# Patient Record
Sex: Female | Born: 1970 | Race: White | Hispanic: Yes | Marital: Married | Smoking: Never smoker
Health system: Southern US, Community
[De-identification: ages and names within clinical notes are randomized; demographics above are authoritative.]

## PROBLEM LIST (undated history)

## (undated) ENCOUNTER — Inpatient Hospital Stay (HOSPITAL_COMMUNITY): Payer: Self-pay

## (undated) DIAGNOSIS — F32A Depression, unspecified: Secondary | ICD-10-CM

## (undated) DIAGNOSIS — E039 Hypothyroidism, unspecified: Secondary | ICD-10-CM

## (undated) DIAGNOSIS — E119 Type 2 diabetes mellitus without complications: Secondary | ICD-10-CM

## (undated) DIAGNOSIS — E669 Obesity, unspecified: Secondary | ICD-10-CM

## (undated) DIAGNOSIS — E785 Hyperlipidemia, unspecified: Secondary | ICD-10-CM

## (undated) DIAGNOSIS — O24419 Gestational diabetes mellitus in pregnancy, unspecified control: Secondary | ICD-10-CM

## (undated) DIAGNOSIS — F329 Major depressive disorder, single episode, unspecified: Secondary | ICD-10-CM

## (undated) DIAGNOSIS — D219 Benign neoplasm of connective and other soft tissue, unspecified: Secondary | ICD-10-CM

## (undated) DIAGNOSIS — R112 Nausea with vomiting, unspecified: Secondary | ICD-10-CM

## (undated) DIAGNOSIS — N390 Urinary tract infection, site not specified: Secondary | ICD-10-CM

## (undated) DIAGNOSIS — Z9889 Other specified postprocedural states: Secondary | ICD-10-CM

## (undated) HISTORY — DX: Obesity, unspecified: E66.9

## (undated) HISTORY — DX: Hypothyroidism, unspecified: E03.9

## (undated) HISTORY — DX: Major depressive disorder, single episode, unspecified: F32.9

## (undated) HISTORY — DX: Hyperlipidemia, unspecified: E78.5

## (undated) HISTORY — DX: Depression, unspecified: F32.A

## (undated) HISTORY — DX: Type 2 diabetes mellitus without complications: E11.9

---

## 2003-09-21 ENCOUNTER — Inpatient Hospital Stay (HOSPITAL_COMMUNITY): Admission: AD | Admit: 2003-09-21 | Discharge: 2003-09-22 | Payer: Self-pay | Admitting: Obstetrics

## 2004-03-27 ENCOUNTER — Inpatient Hospital Stay (HOSPITAL_COMMUNITY): Admission: AD | Admit: 2004-03-27 | Discharge: 2004-03-27 | Payer: Self-pay | Admitting: Obstetrics

## 2004-04-09 ENCOUNTER — Inpatient Hospital Stay (HOSPITAL_COMMUNITY): Admission: AD | Admit: 2004-04-09 | Discharge: 2004-04-12 | Payer: Self-pay | Admitting: Obstetrics

## 2004-06-17 ENCOUNTER — Ambulatory Visit: Payer: Self-pay | Admitting: Internal Medicine

## 2004-06-18 ENCOUNTER — Ambulatory Visit: Payer: Self-pay | Admitting: *Deleted

## 2004-08-05 ENCOUNTER — Ambulatory Visit: Payer: Self-pay | Admitting: Internal Medicine

## 2004-08-13 ENCOUNTER — Ambulatory Visit: Payer: Self-pay | Admitting: Internal Medicine

## 2004-08-13 ENCOUNTER — Ambulatory Visit: Payer: Self-pay | Admitting: *Deleted

## 2004-08-27 ENCOUNTER — Ambulatory Visit: Payer: Self-pay | Admitting: Internal Medicine

## 2004-12-16 ENCOUNTER — Ambulatory Visit: Payer: Self-pay | Admitting: Family Medicine

## 2005-02-13 ENCOUNTER — Ambulatory Visit: Payer: Self-pay | Admitting: Nurse Practitioner

## 2005-04-08 ENCOUNTER — Ambulatory Visit: Payer: Self-pay | Admitting: Nurse Practitioner

## 2005-04-15 ENCOUNTER — Ambulatory Visit: Payer: Self-pay | Admitting: Nurse Practitioner

## 2005-06-04 ENCOUNTER — Ambulatory Visit: Payer: Self-pay | Admitting: Internal Medicine

## 2005-06-18 ENCOUNTER — Ambulatory Visit: Payer: Self-pay | Admitting: Internal Medicine

## 2005-07-30 ENCOUNTER — Ambulatory Visit: Payer: Self-pay | Admitting: Internal Medicine

## 2005-08-10 ENCOUNTER — Ambulatory Visit (HOSPITAL_COMMUNITY): Admission: RE | Admit: 2005-08-10 | Discharge: 2005-08-10 | Payer: Self-pay | Admitting: Family Medicine

## 2005-08-26 ENCOUNTER — Ambulatory Visit: Payer: Self-pay | Admitting: Internal Medicine

## 2005-09-14 HISTORY — PX: DILATION AND CURETTAGE OF UTERUS: SHX78

## 2005-09-21 ENCOUNTER — Ambulatory Visit: Payer: Self-pay | Admitting: Internal Medicine

## 2005-11-03 ENCOUNTER — Ambulatory Visit: Payer: Self-pay | Admitting: Obstetrics & Gynecology

## 2005-12-08 ENCOUNTER — Encounter: Payer: Self-pay | Admitting: Internal Medicine

## 2005-12-08 ENCOUNTER — Ambulatory Visit: Payer: Self-pay | Admitting: Internal Medicine

## 2006-01-01 ENCOUNTER — Ambulatory Visit: Payer: Self-pay | Admitting: Internal Medicine

## 2006-01-08 ENCOUNTER — Ambulatory Visit: Payer: Self-pay | Admitting: Internal Medicine

## 2006-03-03 ENCOUNTER — Ambulatory Visit: Payer: Self-pay | Admitting: Family Medicine

## 2006-04-07 ENCOUNTER — Ambulatory Visit: Payer: Self-pay | Admitting: Family Medicine

## 2006-05-02 ENCOUNTER — Emergency Department (HOSPITAL_COMMUNITY): Admission: EM | Admit: 2006-05-02 | Discharge: 2006-05-02 | Payer: Self-pay | Admitting: Emergency Medicine

## 2006-05-05 ENCOUNTER — Ambulatory Visit: Payer: Self-pay | Admitting: Internal Medicine

## 2006-05-24 ENCOUNTER — Ambulatory Visit: Payer: Self-pay | Admitting: Family Medicine

## 2006-06-18 ENCOUNTER — Ambulatory Visit: Payer: Self-pay | Admitting: Internal Medicine

## 2006-06-30 ENCOUNTER — Ambulatory Visit: Payer: Self-pay | Admitting: Family Medicine

## 2006-07-09 ENCOUNTER — Ambulatory Visit: Payer: Self-pay | Admitting: Family Medicine

## 2006-07-27 ENCOUNTER — Emergency Department (HOSPITAL_COMMUNITY): Admission: EM | Admit: 2006-07-27 | Discharge: 2006-07-28 | Payer: Self-pay | Admitting: Emergency Medicine

## 2006-08-11 ENCOUNTER — Inpatient Hospital Stay (HOSPITAL_COMMUNITY): Admission: AD | Admit: 2006-08-11 | Discharge: 2006-08-11 | Payer: Self-pay | Admitting: Obstetrics

## 2006-09-09 ENCOUNTER — Ambulatory Visit (HOSPITAL_COMMUNITY): Admission: AD | Admit: 2006-09-09 | Discharge: 2006-09-09 | Payer: Self-pay | Admitting: Obstetrics

## 2006-09-09 ENCOUNTER — Inpatient Hospital Stay (HOSPITAL_COMMUNITY): Admission: AD | Admit: 2006-09-09 | Discharge: 2006-09-09 | Payer: Self-pay | Admitting: Obstetrics and Gynecology

## 2006-09-09 ENCOUNTER — Encounter (INDEPENDENT_AMBULATORY_CARE_PROVIDER_SITE_OTHER): Payer: Self-pay | Admitting: *Deleted

## 2006-10-29 ENCOUNTER — Ambulatory Visit: Payer: Self-pay | Admitting: Family Medicine

## 2006-12-14 ENCOUNTER — Ambulatory Visit: Payer: Self-pay | Admitting: Internal Medicine

## 2007-03-24 ENCOUNTER — Ambulatory Visit: Payer: Self-pay | Admitting: Internal Medicine

## 2007-03-28 ENCOUNTER — Ambulatory Visit: Payer: Self-pay | Admitting: Internal Medicine

## 2007-04-05 ENCOUNTER — Ambulatory Visit: Payer: Self-pay | Admitting: Internal Medicine

## 2007-06-01 ENCOUNTER — Encounter (INDEPENDENT_AMBULATORY_CARE_PROVIDER_SITE_OTHER): Payer: Self-pay | Admitting: *Deleted

## 2007-06-29 ENCOUNTER — Ambulatory Visit: Payer: Self-pay | Admitting: Internal Medicine

## 2007-06-29 LAB — CONVERTED CEMR LAB
AST: 15 units/L (ref 0–37)
Albumin: 4.7 g/dL (ref 3.5–5.2)
BUN: 12 mg/dL (ref 6–23)
CO2: 22 meq/L (ref 19–32)
Chloride: 105 meq/L (ref 96–112)
Potassium: 4.9 meq/L (ref 3.5–5.3)
Total Protein: 7.9 g/dL (ref 6.0–8.3)
Triglycerides: 139 mg/dL (ref <150)
VLDL: 28 mg/dL (ref 0–40)

## 2007-08-15 ENCOUNTER — Ambulatory Visit: Payer: Self-pay | Admitting: Internal Medicine

## 2007-08-29 ENCOUNTER — Encounter (INDEPENDENT_AMBULATORY_CARE_PROVIDER_SITE_OTHER): Payer: Self-pay | Admitting: Family Medicine

## 2007-08-29 ENCOUNTER — Ambulatory Visit: Payer: Self-pay | Admitting: Internal Medicine

## 2007-08-29 LAB — CONVERTED CEMR LAB: Chlamydia, DNA Probe: NEGATIVE

## 2007-10-10 ENCOUNTER — Ambulatory Visit: Payer: Self-pay | Admitting: Family Medicine

## 2007-10-10 LAB — CONVERTED CEMR LAB
BUN: 11 mg/dL (ref 6–23)
Calcium: 9.4 mg/dL (ref 8.4–10.5)
Chloride: 105 meq/L (ref 96–112)
Cholesterol: 220 mg/dL — ABNORMAL HIGH (ref 0–200)
Glucose, Bld: 94 mg/dL (ref 70–99)
LDL Cholesterol: 130 mg/dL — ABNORMAL HIGH (ref 0–99)
Sodium: 140 meq/L (ref 135–145)
Total Bilirubin: 0.3 mg/dL (ref 0.3–1.2)
Total CHOL/HDL Ratio: 4.2
Total Protein: 7.8 g/dL (ref 6.0–8.3)

## 2007-11-09 ENCOUNTER — Ambulatory Visit: Payer: Self-pay | Admitting: Internal Medicine

## 2007-11-09 ENCOUNTER — Encounter (INDEPENDENT_AMBULATORY_CARE_PROVIDER_SITE_OTHER): Payer: Self-pay | Admitting: Family Medicine

## 2007-11-14 ENCOUNTER — Ambulatory Visit (HOSPITAL_COMMUNITY): Admission: RE | Admit: 2007-11-14 | Discharge: 2007-11-14 | Payer: Self-pay | Admitting: Family Medicine

## 2007-12-12 ENCOUNTER — Ambulatory Visit: Payer: Self-pay | Admitting: Internal Medicine

## 2007-12-12 LAB — CONVERTED CEMR LAB
Basophils Relative: 1 % (ref 0–1)
Eosinophils Absolute: 0.2 10*3/uL (ref 0.0–0.7)
HCT: 34.2 % — ABNORMAL LOW (ref 36.0–46.0)
Hemoglobin: 11 g/dL — ABNORMAL LOW (ref 12.0–15.0)
MCHC: 32.2 g/dL (ref 30.0–36.0)
Monocytes Absolute: 0.3 10*3/uL (ref 0.1–1.0)
Monocytes Relative: 6 % (ref 3–12)
RBC: 4.32 M/uL (ref 3.87–5.11)
WBC: 4.6 10*3/uL (ref 4.0–10.5)

## 2008-01-04 ENCOUNTER — Ambulatory Visit: Payer: Self-pay | Admitting: Internal Medicine

## 2008-02-23 ENCOUNTER — Ambulatory Visit: Payer: Self-pay | Admitting: Obstetrics & Gynecology

## 2008-03-12 ENCOUNTER — Ambulatory Visit: Payer: Self-pay | Admitting: Family Medicine

## 2008-03-14 ENCOUNTER — Inpatient Hospital Stay (HOSPITAL_COMMUNITY): Admission: AD | Admit: 2008-03-14 | Discharge: 2008-03-14 | Payer: Self-pay | Admitting: Gynecology

## 2008-05-11 ENCOUNTER — Inpatient Hospital Stay (HOSPITAL_COMMUNITY): Admission: AD | Admit: 2008-05-11 | Discharge: 2008-05-11 | Payer: Self-pay | Admitting: Obstetrics & Gynecology

## 2008-05-31 ENCOUNTER — Ambulatory Visit: Payer: Self-pay | Admitting: Obstetrics & Gynecology

## 2008-06-06 ENCOUNTER — Ambulatory Visit: Payer: Self-pay | Admitting: Family Medicine

## 2008-06-11 ENCOUNTER — Ambulatory Visit (HOSPITAL_COMMUNITY): Admission: RE | Admit: 2008-06-11 | Discharge: 2008-06-11 | Payer: Self-pay | Admitting: Family Medicine

## 2008-06-28 ENCOUNTER — Ambulatory Visit: Payer: Self-pay | Admitting: Obstetrics & Gynecology

## 2008-06-28 ENCOUNTER — Ambulatory Visit (HOSPITAL_COMMUNITY): Admission: RE | Admit: 2008-06-28 | Discharge: 2008-06-28 | Payer: Self-pay | Admitting: Family Medicine

## 2008-07-05 ENCOUNTER — Ambulatory Visit: Payer: Self-pay | Admitting: Obstetrics & Gynecology

## 2008-07-05 ENCOUNTER — Encounter (INDEPENDENT_AMBULATORY_CARE_PROVIDER_SITE_OTHER): Payer: Self-pay | Admitting: Gynecology

## 2008-07-19 ENCOUNTER — Ambulatory Visit: Payer: Self-pay | Admitting: Obstetrics & Gynecology

## 2008-08-02 ENCOUNTER — Ambulatory Visit: Payer: Self-pay | Admitting: Obstetrics & Gynecology

## 2008-08-23 ENCOUNTER — Ambulatory Visit: Payer: Self-pay | Admitting: Family Medicine

## 2008-08-23 ENCOUNTER — Encounter: Payer: Self-pay | Admitting: Obstetrics & Gynecology

## 2008-08-23 LAB — CONVERTED CEMR LAB
MCHC: 34.2 g/dL (ref 30.0–36.0)
Platelets: 211 10*3/uL (ref 150–400)
RBC: 4.1 M/uL (ref 3.87–5.11)
WBC: 6.5 10*3/uL (ref 4.0–10.5)

## 2008-09-03 ENCOUNTER — Encounter: Admission: RE | Admit: 2008-09-03 | Discharge: 2008-09-03 | Payer: Self-pay | Admitting: Obstetrics & Gynecology

## 2008-09-03 ENCOUNTER — Ambulatory Visit: Payer: Self-pay | Admitting: Family Medicine

## 2008-09-10 ENCOUNTER — Ambulatory Visit (HOSPITAL_COMMUNITY): Admission: RE | Admit: 2008-09-10 | Discharge: 2008-09-10 | Payer: Self-pay | Admitting: Family Medicine

## 2008-09-10 ENCOUNTER — Ambulatory Visit: Payer: Self-pay | Admitting: Family Medicine

## 2008-09-17 ENCOUNTER — Ambulatory Visit: Payer: Self-pay | Admitting: Family Medicine

## 2008-09-17 ENCOUNTER — Encounter (INDEPENDENT_AMBULATORY_CARE_PROVIDER_SITE_OTHER): Payer: Self-pay | Admitting: Gynecology

## 2008-10-01 ENCOUNTER — Ambulatory Visit: Payer: Self-pay | Admitting: Family Medicine

## 2008-10-02 ENCOUNTER — Inpatient Hospital Stay (HOSPITAL_COMMUNITY): Admission: AD | Admit: 2008-10-02 | Discharge: 2008-10-05 | Payer: Self-pay | Admitting: Family Medicine

## 2008-10-02 ENCOUNTER — Ambulatory Visit: Payer: Self-pay | Admitting: Family Medicine

## 2008-10-03 ENCOUNTER — Encounter: Payer: Self-pay | Admitting: Obstetrics & Gynecology

## 2008-12-26 ENCOUNTER — Ambulatory Visit: Payer: Self-pay | Admitting: Internal Medicine

## 2009-05-06 ENCOUNTER — Ambulatory Visit: Payer: Self-pay | Admitting: Internal Medicine

## 2009-07-18 ENCOUNTER — Ambulatory Visit: Payer: Self-pay | Admitting: Family Medicine

## 2009-10-16 ENCOUNTER — Ambulatory Visit: Payer: Self-pay | Admitting: Internal Medicine

## 2009-10-16 LAB — CONVERTED CEMR LAB
ALT: 10 units/L (ref 0–35)
AST: 16 units/L (ref 0–37)
Albumin: 4.6 g/dL (ref 3.5–5.2)
Alkaline Phosphatase: 71 units/L (ref 39–117)
Basophils Relative: 1 % (ref 0–1)
Calcium: 9 mg/dL (ref 8.4–10.5)
Creatinine, Ser: 0.53 mg/dL (ref 0.40–1.20)
Eosinophils Absolute: 0.3 10*3/uL (ref 0.0–0.7)
Ferritin: 14 ng/mL (ref 10–291)
HCT: 39 % (ref 36.0–46.0)
Iron: 75 ug/dL (ref 42–145)
Lymphocytes Relative: 54 % — ABNORMAL HIGH (ref 12–46)
Lymphs Abs: 3 10*3/uL (ref 0.7–4.0)
MCHC: 33.6 g/dL (ref 30.0–36.0)
Monocytes Absolute: 0.3 10*3/uL (ref 0.1–1.0)
Neutro Abs: 2 10*3/uL (ref 1.7–7.7)
Neutrophils Relative %: 35 % — ABNORMAL LOW (ref 43–77)
Platelets: 222 10*3/uL (ref 150–400)
Total Protein: 7.8 g/dL (ref 6.0–8.3)

## 2009-10-17 ENCOUNTER — Ambulatory Visit: Payer: Self-pay | Admitting: Internal Medicine

## 2009-10-30 ENCOUNTER — Ambulatory Visit (HOSPITAL_COMMUNITY): Admission: RE | Admit: 2009-10-30 | Discharge: 2009-10-30 | Payer: Self-pay | Admitting: Internal Medicine

## 2009-10-30 ENCOUNTER — Ambulatory Visit: Payer: Self-pay | Admitting: Internal Medicine

## 2009-10-30 LAB — CONVERTED CEMR LAB
Basophils Absolute: 0 10*3/uL (ref 0.0–0.1)
Basophils Relative: 1 % (ref 0–1)
CO2: 24 meq/L (ref 19–32)
Calcium: 9.5 mg/dL (ref 8.4–10.5)
Cholesterol: 229 mg/dL — ABNORMAL HIGH (ref 0–200)
Eosinophils Absolute: 0.2 10*3/uL (ref 0.0–0.7)
Eosinophils Relative: 3 % (ref 0–5)
HDL: 57 mg/dL (ref >39)
Hemoglobin: 13.5 g/dL (ref 12.0–15.0)
Iron: 69 ug/dL (ref 42–145)
Lymphocytes Relative: 54 % — ABNORMAL HIGH (ref 12–46)
Lymphs Abs: 2.7 10*3/uL (ref 0.7–4.0)
Monocytes Relative: 5 % (ref 3–12)
Neutrophils Relative %: 38 % — ABNORMAL LOW (ref 43–77)
Potassium: 4.1 meq/L (ref 3.5–5.3)
Saturation Ratios: 16 % — ABNORMAL LOW (ref 20–55)
Total Bilirubin: 0.4 mg/dL (ref 0.3–1.2)
Total Protein: 8.2 g/dL (ref 6.0–8.3)
VLDL: 19 mg/dL (ref 0–40)

## 2009-11-13 ENCOUNTER — Ambulatory Visit: Payer: Self-pay | Admitting: Internal Medicine

## 2009-11-20 ENCOUNTER — Ambulatory Visit (HOSPITAL_COMMUNITY): Admission: RE | Admit: 2009-11-20 | Discharge: 2009-11-20 | Payer: Self-pay | Admitting: *Deleted

## 2009-12-05 ENCOUNTER — Ambulatory Visit: Payer: Self-pay | Admitting: Internal Medicine

## 2009-12-19 ENCOUNTER — Ambulatory Visit: Payer: Self-pay | Admitting: Obstetrics and Gynecology

## 2009-12-19 LAB — CONVERTED CEMR LAB
Chlamydia, DNA Probe: NEGATIVE
GC Probe Amp, Genital: NEGATIVE

## 2009-12-20 ENCOUNTER — Encounter: Payer: Self-pay | Admitting: Obstetrics and Gynecology

## 2009-12-20 LAB — CONVERTED CEMR LAB
Trich, Wet Prep: NONE SEEN
Yeast Wet Prep HPF POC: NONE SEEN

## 2010-01-16 ENCOUNTER — Ambulatory Visit: Payer: Self-pay | Admitting: Obstetrics & Gynecology

## 2010-02-27 ENCOUNTER — Ambulatory Visit: Payer: Self-pay | Admitting: Internal Medicine

## 2010-02-27 LAB — CONVERTED CEMR LAB
HDL: 47 mg/dL (ref >39)
Hgb A1c MFr Bld: 6.3 % — ABNORMAL HIGH (ref <5.7)

## 2010-03-06 ENCOUNTER — Ambulatory Visit: Payer: Self-pay | Admitting: Internal Medicine

## 2010-03-06 LAB — CONVERTED CEMR LAB
Ferritin: 34 ng/mL (ref 10–291)
Free Thyroxine Index: 4 — ABNORMAL HIGH (ref 1.0–3.9)
T3 Uptake Ratio: 33.2 % (ref 22.5–37.0)
T4, Total: 12 ug/dL (ref 5.0–12.5)
TSH: 0.028 microintl units/mL — ABNORMAL LOW (ref 0.350–4.500)
Thyroglobulin Ab: 325.6 — ABNORMAL HIGH (ref 0.0–60.0)
Thyroperoxidase Ab SerPl-aCnc: 8952.2 — ABNORMAL HIGH (ref 0.0–60.0)

## 2010-03-27 ENCOUNTER — Ambulatory Visit: Payer: Self-pay | Admitting: Internal Medicine

## 2010-03-27 LAB — CONVERTED CEMR LAB
T3, Free: 4.6 pg/mL — ABNORMAL HIGH (ref 2.3–4.2)
TSH: <0.004 microintl units/mL — ABNORMAL LOW (ref 0.350–4.500)
Vit D, 25-Hydroxy: 39 ng/mL (ref 30–89)

## 2010-05-08 ENCOUNTER — Ambulatory Visit: Payer: Self-pay | Admitting: Internal Medicine

## 2010-08-04 ENCOUNTER — Ambulatory Visit: Payer: Self-pay | Admitting: Internal Medicine

## 2010-10-04 ENCOUNTER — Encounter: Payer: Self-pay | Admitting: Internal Medicine

## 2010-10-05 ENCOUNTER — Encounter: Payer: Self-pay | Admitting: Family Medicine

## 2010-12-08 ENCOUNTER — Encounter: Payer: Self-pay | Admitting: Internal Medicine

## 2010-12-08 ENCOUNTER — Other Ambulatory Visit: Payer: Self-pay | Admitting: Family Medicine

## 2010-12-29 LAB — POCT URINALYSIS DIP (DEVICE)
Bilirubin Urine: NEGATIVE
Ketones, ur: NEGATIVE mg/dL
Nitrite: NEGATIVE
Specific Gravity, Urine: 1.01 (ref 1.005–1.030)
Urobilinogen, UA: 0.2 mg/dL (ref 0.0–1.0)
Urobilinogen, UA: 0.2 mg/dL (ref 0.0–1.0)
pH: 7 (ref 5.0–8.0)

## 2010-12-29 LAB — URINALYSIS, ROUTINE W REFLEX MICROSCOPIC
Ketones, ur: 15 mg/dL — AB
Leukocytes, UA: NEGATIVE
Nitrite: NEGATIVE
Protein, ur: NEGATIVE mg/dL
pH: 6 (ref 5.0–8.0)

## 2010-12-29 LAB — GLUCOSE, CAPILLARY
Glucose-Capillary: 66 mg/dL — ABNORMAL LOW (ref 70–99)
Glucose-Capillary: 99 mg/dL (ref 70–99)

## 2010-12-29 LAB — STREP B DNA PROBE: Strep Group B Ag: NEGATIVE

## 2010-12-29 LAB — WET PREP, GENITAL: Clue Cells Wet Prep HPF POC: NONE SEEN

## 2010-12-29 LAB — URINE CULTURE: Colony Count: 30000

## 2010-12-29 LAB — CBC
Hemoglobin: 13.2 g/dL (ref 12.0–15.0)
RBC: 4.25 MIL/uL (ref 3.87–5.11)
WBC: 6.6 10*3/uL (ref 4.0–10.5)

## 2010-12-29 LAB — RPR: RPR Ser Ql: NONREACTIVE

## 2010-12-29 LAB — URINE MICROSCOPIC-ADD ON

## 2011-01-27 NOTE — Group Therapy Note (Signed)
NAMEKALIYA, SHREINER NO.:  [id number]   MEDICAL RECORD NO.:  [id number]          PATIENT TYPE:  WOC   LOCATION:  [facility]                   FACILITY:  [facility]   PHYSICIAN:  Allie Bossier, MD        DATE OF BIRTH:  1971-04-16   DATE OF SERVICE:  02/23/2008                                  CLINIC NOTE   Sarah Massey is a 40 year old married Hispanic gravida 6, para 4,  abortus 2 with four sons.  She comes in here as a referral from  [facility] to evaluate two-year history of dysmenorrhea.  An ultrasound  was done November 14, 2007, that showed a 2 cm posterior fibroid.  She says  that she does get some relief when she takes over-the-counter ibuprofen  twice a day or Tylenol.  The pain is primarily located in her left lower  quadrant.  She took oral contraceptive pills from October 2008 to  January 2009 to see if the pain would be relieved.  This did not  alleviate her pain.  She then used no contraception for February, March  and April and she started using condoms this past month because she did  not want to show up pregnant for her visit.  However, she does wish to  be pregnant and understands that she should start prenatal  vitamins/folic acid and that condoms will keep her from getting  pregnant.   MEDICAL HISTORY:  Dysmenorrhea and asthma.   REVIEW OF SYSTEMS:  She has been married for the last 22 years.  She  denies dyspareunia except with her periods.  She reports her periods are  every 22 days and last for five days.  She has a Pap smear scheduled  March 09, 2008, at [facility].   PAST SURGICAL HISTORY:  Two D&Cs after miscarriages. C-section x1.  Please note, she was diabetic with her last pregnancy.   FAMILY HISTORY:  Negative for breast, GYN and colon malignancies.   ALLERGIES:  No latex allergies.  No allergies to medicines.   SOCIAL HISTORY:  Negative for breast, GYN and colon malignancies social  history is negative for tobacco, alcohol or drug  use.   PHYSICAL EXAMINATION:  VITAL SIGNS:  Weight 155 pounds.  Please note  this is a 30 pound weight loss since last year at the recommendation of  her [facility] physician.  Her blood pressure is 199/67, her pulse is  65.  She is afebrile  GU:  External genitalia is normal.  Cervix is normal.  Uterus is 6-week  size, anteverted, mobile and nontender.  Adnexa are not enlarged and  nontender with palpation.  On her pelvic exam, she does have acanthosis  nigricans.   ASSESSMENT/PLAN:  1. Dysmenorrhea.  I have checked cervical cultures.  2. Acanthosis nigricans and polyuria.  I have checked a CBG, randomly      is 102.  I have counseled her about the risks of diabetes      especially after gestational diabetes and encourage more weight      loss.  3. Desire for pregnancy.  I have recommended prenatal vitamins plus      folic acid and suggested that she  take Motrin for her pain.  I have      given her a prescription for 800 mg p.o. q.8h.      Allie Bossier, MD     MCD/MEDQ  D:  02/23/2008  T:  02/23/2008  Job:  [id number]

## 2011-01-30 NOTE — Discharge Summary (Signed)
Sarah Massey, Sarah Massey                ACCOUNT NO.:  [id number]   MEDICAL RECORD NO.:  [id number]                   PATIENT TYPE:  INP   LOCATION:  9109                                 [facility]   PHYSICIAN:  Kathreen Cosier, M.D.           DATE OF BIRTH:  [birth year]   DATE OF ADMISSION:  04/09/2004  DATE OF DISCHARGE:  04/12/2004                                 DISCHARGE SUMMARY   The patient is a 40 year old, gravida 4, para 2-1-0-3 whose due date was  May 02, 2004.  The patient was diabetic in this pregnancy and was  followed by [facility]. She was not on any medications and her sugars  all remained normal.  She was admitted at 36 weeks in labor, breech  presentation, double-footling and underwent a Primary low transverse  cesarean section by Roseanna Rainbow, M.D.  Apgar's were 8 & 9.  Her  urinalysis was negative.  On admission, her hemoglobin was 13.7, platelets  206, postop hemoglobin was 10.6. She did well and was discharged home on the  third postoperative day ambulatory on a regular diet to see me in six weeks.   DISCHARGE DIAGNOSES:  Status post Primary low transverse cesarean section at  36 weeks because of breech presentation.                                               Kathreen Cosier, M.D.    BAM/MEDQ  D:  04/12/2004  T:  04/12/2004  Job:  [id number]

## 2011-01-30 NOTE — Op Note (Signed)
Sarah Massey, Sarah Massey                ACCOUNT NO.:  [id number]   MEDICAL RECORD NO.:  [id number]                   PATIENT TYPE:  INP   LOCATION:  9199                                 [facility]   PHYSICIAN:  Roseanna Rainbow, M.D.         DATE OF BIRTH:  July 07, 1971   DATE OF PROCEDURE:  04/09/2004  DATE OF DISCHARGE:                                 OPERATIVE REPORT   PREOPERATIVE DIAGNOSES:  Intrauterine pregnancy at 36 plus weeks with  preterm premature rupture of membranes, breech presentation.   POSTOPERATIVE DIAGNOSES:  Intrauterine pregnancy at 36 plus weeks with  preterm premature rupture of membranes, breech presentation.  Doubling-  footling breech.   PROCEDURE:  Primary low uterine elliptical cesarean delivery via  Pfannenstiel skin incision.   SURGEON:  Roseanna Rainbow, M.D.   ANESTHESIA:  Spinal.   ESTIMATED BLOOD LOSS:  800 mL.   URINE OUTPUT:  As per anesthesiology.   COMPLICATIONS:  None.   DESCRIPTION OF PROCEDURE:  The patient was taken to the operating room where  a spinal anesthetic was administered without difficulty. She was then placed  in the dorsal supine position with a leftward tilt.  She was prepped and  draped in the usual sterile fashion. A Pfannenstiel skin incision was then  made with the scalpel and carried down to the underlying fascia with the  Bovie. The fascia was incised in the midline. This incision was then  extended bilaterally with curved Mayo scissors. The superior aspect of the  fascial incision was then grasped with Kocher clamps, tinted up in the  underlying rectus muscles and dissected off. The inferior aspect of the  fascial incision was then manipulated in a similar fashion. The rectus  muscles were separated in the midline. The parietoperitoneum was tinted up  and entered sharply with Metzenbaum scissors. This incision was then  extended superiorly and inferiorly with good visualization of the  bladder.  The bladder blade was then placed. The vesicouterine peritoneum was tinted  up and entered sharply. This incision was then extended bilaterally and the  bladder flap created digitally. The bladder blade was then replaced.  The  lower uterine segment was then incised in a transverse fashion. This  incision was extended bilaterally with bandage scissors. A complete breech  extraction was then performed. The infant was suctioned with bulb suction.  The cord was clamped and the infant was handed over to the awaiting  neonatologist. Apgar's were 8 & 9 at 1 and 5 minutes respectively. The  placenta was then removed. The uterus was evacuated of any amniotic fluid  clots and debrided with a moistened laparotomy sponge.  The uterine incision  was reapproximated with #0 Monocryl in a running locking fashion. A second  imbricating layer was then placed.  Adequate hemostasis was noted. The  pericolic gutters were then copiously irrigated. The  parietoperitoneum was reapproximated in a running fashion with 2-0 Vicryl.  The fascia was reapproximated with #0 Vicryl in a running  fashion. The skin  was reapproximated with staples. At the close of the procedure, the  instrument and pack counts were said to be correct x2. The patient was taken  to the PACU awake and in stable condition.                                               Roseanna Rainbow, M.D.    Judee Clara  D:  04/09/2004  T:  04/09/2004  Job:  [id number]

## 2011-01-30 NOTE — Op Note (Signed)
NAME:  Sarah Massey, Sarah Massey     ACCOUNT NO.:  [id number]   MEDICAL RECORD NO.:  [id number]          PATIENT TYPE:  AMB   LOCATION:                                [facility]   PHYSICIAN:  Kathreen Cosier, M.D.DATE OF BIRTH:  1970/11/11   DATE OF PROCEDURE:  09/09/2006  DATE OF DISCHARGE:  09/09/2006                               OPERATIVE REPORT   PREOPERATIVE DIAGNOSIS:  A 12+ week intrauterine fetal demise.   POSTOPERATIVE DIAGNOSIS:  A 12+ week intrauterine fetal demise.   PROCEDURE:  Dilatation and evacuation.   SURGEON:  Kathreen Cosier, M.D.   ANESTHESIA:  MAC   DESCRIPTION OF PROCEDURE:  Using MAC with the patient in the lithotomy  position, the perineum and vagina prepped and draped.  Bladder emptied  with a straight catheter.  Bimanual exam revealed the uterus to be 12-14  weeks' size.  Speculum placed in the vagina.  The cervix injected with  10 mL of 1% Xylocaine.  The endometrial cavity sounded to 13 cm.  The  cervix was dilated with #33 Shawnie Pons.  A #12 suction used to aspirate the  uterine contents until clean.  A sharp curettage was also performed post  suction.   The patient tolerated the procedure well and taken to the recovery room  in good condition.           ______________________________  Kathreen Cosier, M.D.     BAM/MEDQ  D:  09/09/2006  T:  09/09/2006  Job:  [id number]

## 2011-03-25 ENCOUNTER — Other Ambulatory Visit (HOSPITAL_COMMUNITY): Payer: Self-pay | Admitting: Family Medicine

## 2011-03-25 DIAGNOSIS — E0590 Thyrotoxicosis, unspecified without thyrotoxic crisis or storm: Secondary | ICD-10-CM

## 2011-04-13 ENCOUNTER — Encounter (HOSPITAL_COMMUNITY)
Admission: RE | Admit: 2011-04-13 | Discharge: 2011-04-13 | Disposition: A | Discharge status: Home / Self Care | Payer: Self-pay | Source: Ambulatory Visit | Attending: Family Medicine | Admitting: Family Medicine

## 2011-04-13 DIAGNOSIS — E0590 Thyrotoxicosis, unspecified without thyrotoxic crisis or storm: Secondary | ICD-10-CM | POA: Insufficient documentation

## 2011-04-14 ENCOUNTER — Encounter (HOSPITAL_COMMUNITY)
Admission: RE | Admit: 2011-04-14 | Discharge: 2011-04-14 | Disposition: A | Discharge status: Home / Self Care | Payer: Self-pay | Source: Ambulatory Visit | Attending: Family Medicine | Admitting: Family Medicine

## 2011-04-14 MED ORDER — SODIUM IODIDE I 131 CAPSULE
9.2000 | Freq: Once | INTRAVENOUS | Status: AC | PRN
  Administered 2011-04-14: 9.2 via ORAL

## 2011-04-14 MED ORDER — SODIUM PERTECHNETATE TC 99M INJECTION
10.0000 | Freq: Once | INTRAVENOUS | Status: AC | PRN
  Administered 2011-04-14: 10 via INTRAVENOUS

## 2011-06-11 LAB — URINALYSIS, ROUTINE W REFLEX MICROSCOPIC
Ketones, ur: NEGATIVE
Leukocytes, UA: NEGATIVE
Nitrite: NEGATIVE
Protein, ur: NEGATIVE
Urobilinogen, UA: 0.2

## 2011-06-11 LAB — GC/CHLAMYDIA PROBE AMP, GENITAL: Chlamydia, DNA Probe: NEGATIVE

## 2011-06-11 LAB — HCG, QUANTITATIVE, PREGNANCY: hCG, Beta Chain, Quant, S: 26700 — ABNORMAL HIGH

## 2011-06-11 LAB — CBC
HCT: 41.2
Hemoglobin: 13.9
MCV: 83.2
RBC: 4.95
WBC: 8.4

## 2011-06-11 LAB — URINE MICROSCOPIC-ADD ON

## 2011-06-11 LAB — WET PREP, GENITAL: Yeast Wet Prep HPF POC: NONE SEEN

## 2011-06-15 LAB — POCT URINALYSIS DIP (DEVICE)
Ketones, ur: NEGATIVE
Ketones, ur: NEGATIVE
Ketones, ur: NEGATIVE
Protein, ur: NEGATIVE
Protein, ur: NEGATIVE
Protein, ur: NEGATIVE
Specific Gravity, Urine: 1.01
Specific Gravity, Urine: 1.01
Specific Gravity, Urine: 1.01
pH: 7
pH: 7
pH: 7

## 2011-06-16 LAB — POCT URINALYSIS DIP (DEVICE)
Hgb urine dipstick: NEGATIVE
Ketones, ur: NEGATIVE
Protein, ur: 30 — AB
Protein, ur: NEGATIVE
Specific Gravity, Urine: 1.01
Specific Gravity, Urine: 1.02
Urobilinogen, UA: 0.2
pH: 7

## 2011-06-19 LAB — POCT URINALYSIS DIP (DEVICE)
Bilirubin Urine: NEGATIVE
Hgb urine dipstick: NEGATIVE
Hgb urine dipstick: NEGATIVE
Hgb urine dipstick: NEGATIVE
Ketones, ur: NEGATIVE mg/dL
Ketones, ur: NEGATIVE mg/dL
Nitrite: NEGATIVE
Protein, ur: NEGATIVE mg/dL
Protein, ur: NEGATIVE mg/dL
Specific Gravity, Urine: 1.015 (ref 1.005–1.030)
Urobilinogen, UA: 0.2 mg/dL (ref 0.0–1.0)
pH: 7 (ref 5.0–8.0)
pH: 7 (ref 5.0–8.0)
pH: 7 (ref 5.0–8.0)

## 2011-08-26 ENCOUNTER — Other Ambulatory Visit (HOSPITAL_COMMUNITY): Payer: Self-pay | Admitting: Family Medicine

## 2011-08-26 DIAGNOSIS — R102 Pelvic and perineal pain: Secondary | ICD-10-CM

## 2011-08-31 ENCOUNTER — Ambulatory Visit (HOSPITAL_COMMUNITY)
Admission: RE | Admit: 2011-08-31 | Discharge: 2011-08-31 | Disposition: A | Discharge status: Home / Self Care | Payer: Self-pay | Source: Ambulatory Visit | Attending: Family Medicine | Admitting: Family Medicine

## 2011-08-31 DIAGNOSIS — R9389 Abnormal findings on diagnostic imaging of other specified body structures: Secondary | ICD-10-CM | POA: Insufficient documentation

## 2011-08-31 DIAGNOSIS — N949 Unspecified condition associated with female genital organs and menstrual cycle: Secondary | ICD-10-CM | POA: Insufficient documentation

## 2011-08-31 DIAGNOSIS — R102 Pelvic and perineal pain: Secondary | ICD-10-CM

## 2011-09-15 NOTE — L&D Delivery Note (Signed)
Delivery Note At 12:03 PM a viable and healthy female was delivered via Vaginal, Spontaneous Delivery (Presentation: Left Occiput Anterior).  APGAR: 9, 9; weight pending.   Placenta status: Intact, Spontaneous.  Cord: 3 vessels with the following complications: None.  Cord pH: not drawn  Anesthesia: Epidural  Episiotomy: None Lacerations: None Suture Repair: none Est. Blood Loss (mL): 300  Mom to postpartum.  Baby to nursery-stable.  Huston Stonehocker 05/20/2012, 12:13 PM

## 2011-09-15 NOTE — L&D Delivery Note (Signed)
I was present for delivery and agree with note above. MUHAMMAD,Sarah Massey  

## 2011-10-29 ENCOUNTER — Encounter: Payer: Self-pay | Admitting: Obstetrics and Gynecology

## 2011-10-29 ENCOUNTER — Inpatient Hospital Stay (HOSPITAL_COMMUNITY)
Admission: AD | Admit: 2011-10-29 | Discharge: 2011-10-29 | Disposition: A | Discharge status: Home / Self Care | Payer: Self-pay | Source: Ambulatory Visit | Attending: Obstetrics & Gynecology | Admitting: Obstetrics & Gynecology

## 2011-10-29 ENCOUNTER — Inpatient Hospital Stay (HOSPITAL_COMMUNITY): Payer: Self-pay

## 2011-10-29 ENCOUNTER — Ambulatory Visit (INDEPENDENT_AMBULATORY_CARE_PROVIDER_SITE_OTHER): Payer: Self-pay | Admitting: Obstetrics and Gynecology

## 2011-10-29 ENCOUNTER — Encounter (HOSPITAL_COMMUNITY): Payer: Self-pay | Admitting: *Deleted

## 2011-10-29 VITALS — BP 119/70 | HR 88 | Temp 97.4°F | Ht 61.0 in | Wt 154.7 lb

## 2011-10-29 DIAGNOSIS — Z1389 Encounter for screening for other disorder: Secondary | ICD-10-CM

## 2011-10-29 DIAGNOSIS — R1084 Generalized abdominal pain: Secondary | ICD-10-CM

## 2011-10-29 DIAGNOSIS — O26899 Other specified pregnancy related conditions, unspecified trimester: Secondary | ICD-10-CM

## 2011-10-29 DIAGNOSIS — R1032 Left lower quadrant pain: Secondary | ICD-10-CM | POA: Insufficient documentation

## 2011-10-29 DIAGNOSIS — D259 Leiomyoma of uterus, unspecified: Secondary | ICD-10-CM

## 2011-10-29 DIAGNOSIS — Z331 Pregnant state, incidental: Secondary | ICD-10-CM

## 2011-10-29 DIAGNOSIS — Z3490 Encounter for supervision of normal pregnancy, unspecified, unspecified trimester: Secondary | ICD-10-CM

## 2011-10-29 DIAGNOSIS — O99891 Other specified diseases and conditions complicating pregnancy: Secondary | ICD-10-CM | POA: Insufficient documentation

## 2011-10-29 DIAGNOSIS — R109 Unspecified abdominal pain: Secondary | ICD-10-CM

## 2011-10-29 HISTORY — DX: Nausea with vomiting, unspecified: R11.2

## 2011-10-29 HISTORY — DX: Urinary tract infection, site not specified: N39.0

## 2011-10-29 HISTORY — DX: Gestational diabetes mellitus in pregnancy, unspecified control: O24.419

## 2011-10-29 HISTORY — DX: Benign neoplasm of connective and other soft tissue, unspecified: D21.9

## 2011-10-29 HISTORY — DX: Other specified postprocedural states: Z98.890

## 2011-10-29 LAB — URINALYSIS, ROUTINE W REFLEX MICROSCOPIC
Bilirubin Urine: NEGATIVE
Glucose, UA: NEGATIVE mg/dL
Protein, ur: NEGATIVE mg/dL

## 2011-10-29 LAB — URINE MICROSCOPIC-ADD ON

## 2011-10-29 NOTE — Discharge Instructions (Signed)
Dolor abdominal en el embarazo  (Abdominal Pain During Pregnancy) Las molestias abdominales son frecuentes Academic librarian. Generalmente no causan ningn dao. Puede tener numerosas causas. Algunas causas son ms graves que otras. Ciertas causas se diagnostican fcilmente. En algunos casos, se demora algn tiempo para comprender el diagnstico. Otras veces la causa no se conoce. El dolor abdominal puede ser un signo de que algo no anda bien en el [place] puede ser debido a una causa totalmente diferente. Por este motivo, siempre comente a su mdico cuando sienta molestias abdominales.  CAUSAS  Las causas ms frecuentes y que no causan ningn dao son:   Constipacin.   Exceso de gases y meteorismo.   Dolor en el ligamento redondo. Este dolor se siente en los pliegues de la ingle.   La posicin en que se encuentra el beb o la placenta.   Las pataditas del beb.   Contracciones de Braxton-Hicks. Estas son contracciones suaves que no producen dilatacin del cuello.  Otras causas graves de dolor abdominal son:   Vanetta Mulders ectpico Se produce cuando un vulo fertilizado se implanta fuera del tero.   Aborto espontneo.   Parto prematuro. El parto prematuro comienza antes de la semana 37 de [employer].   Desprendimiento de la placenta. Ocurre cuando la placenta se separa parcial o completamente del tero.   Preeclampsia Generalmente se asocia a hipertensin arterial y tambin se denomina "toxemia del embarazo".   Infecciones del tero o del lquido amnitico.  Las causas que no se relacionan con Firefighter son:   Infeccin del tracto urinario.   Clculos o inflamacin de la vescula.   Hepatitis u otras enfermedades del hgado.   Trastornos intestinales, virus en el estmago, intoxicacin alimentaria, lcera.   Apendicitis.   Clculos en el rin (renales).   Infeccin renal (pielonefritis).  INSTRUCCIONES PARA EL CUIDADO EN EL HOGAR  Si el dolor es leve:   No tenga  relaciones sexuales y no coloque nada dentro de la vagina hasta que los sntomas hayan desaparecido completamente.   Descanse todo lo que pueda hasta que el dolor haya calmado. Si el dolor no mejora en una hora, comunquese con su mdico.   Si siente nuseas, beba lquidos claros. Evite los alimentos slidos hasta que no sienta Dentist en el estmago o desaparezcan las nuseas.   Tome slo la medicacin que le indic el profesional.   Concurra puntualmente a las citas con el mdico.  SOLICITE ATENCIN MDICA DE INMEDIATO SI:   Tiene un sangrado, prdida de lquidos o lo observa al limpiarse la vagina con tis.   El dolor o los clicos [place].   Tiene vmitos persistentes.   Comienza a Financial risk analyst al orinar u [employer].   Tiene fiebre.   Los movimientos del beb disminuyen.   Nota un debilitamiento extremo o se marea.   Tiene dificultad para respirar con o sin dolor abdominal.   Siente un dolor de cabeza intenso junto al dolor abdominal.   Tiene secrecin vaginal con dolor abdominal.   Tiene diarrea persistente.   El dolor abdominal sigue an despus de Field seismologist.  ASEGRESE DE QUE:   Comprende estas instrucciones.   Controlar su enfermedad.   Solicitar ayuda de inmediato si no mejora o si empeora.  Document Released: 08/31/2005 Document Revised: 05/13/2011 [facility] Patient Information 2012 [place], [place].

## 2011-10-29 NOTE — Progress Notes (Signed)
abd pain left side, this is similar to what she had in Dec.  Has had this pain about a year.  Pain is worse now, esp at night.  Having irritating d/c with odor, started 3 months ago, was worse.

## 2011-10-29 NOTE — ED Provider Notes (Signed)
History   Pt presents today after being seen in the GYN clinic for pelvic pain. She c/o LLQ pain for the past year. She had a positive preg test in the clinic today and was instructed to come to the MAU for evaluation. She does report a vag dc with odor for the past several months. She denies vag bleeding, fever, or any other sx at this time. She has a hx of fibroids.  Chief Complaint  Patient presents with  . Abdominal Pain  . Vaginal Discharge   HPI  OB History    Grav Para Term Preterm Abortions TAB SAB Ect Mult Living   7 5 3 2 1  0 1 0 0 5      Past Medical History  Diagnosis Date  . Gestational diabetes   . Asthma   . Urinary tract infection   . Fibroid   . PONV (postoperative nausea and vomiting)     Past Surgical History  Procedure Date  . Dilation and curettage of uterus   . Cesarean section     Family History  Problem Relation Age of Onset  . Anesthesia problems Neg Hx     History  Substance Use Topics  . Smoking status: Not on file  . Smokeless tobacco: Never Used  . Alcohol Use: No    Allergies: Allergies not on file  No prescriptions prior to admission    Review of Systems  Constitutional: Negative for fever.  Eyes: Negative for blurred vision and double vision.  Respiratory: Negative for cough, hemoptysis, sputum production, shortness of breath and wheezing.   Cardiovascular: Negative for chest pain and palpitations.  Gastrointestinal: Positive for abdominal pain. Negative for nausea, vomiting, diarrhea and constipation.  Genitourinary: Negative for dysuria, urgency, frequency and hematuria.  Neurological: Negative for dizziness and headaches.  Psychiatric/Behavioral: Negative for depression and suicidal ideas.   Physical Exam   Blood pressure 120/55, pulse 82, temperature 99.5 F (37.5 C), temperature source Oral, resp. rate 20, height 5' 0.25" (1.53 m), weight 154 lb (69.854 kg), last menstrual period 08/25/2011, SpO2 99.00%.  Physical  Exam  Nursing note and vitals reviewed. Constitutional: She is oriented to person, place, and time. She appears well-developed and well-nourished. No distress.  HENT:  Head: Normocephalic and atraumatic.  Eyes: EOM are normal. Pupils are equal, round, and reactive to light.  GI: Soft. She exhibits no distension. There is tenderness (Moderate tenderness to LLQ.). There is no rebound and no guarding.  Genitourinary: No bleeding around the vagina. Vaginal discharge found.       Beige creamy vag dc present. Cervix Lg/closed. Uterus 10wks size. No adnexal masses. Pt tender to LLQ on exam. No rebound. No guarding.  Neurological: She is alert and oriented to person, place, and time.  Skin: Skin is warm and dry. She is not diaphoretic.  Psychiatric: She has a normal mood and affect. Her behavior is normal. Judgment and thought content normal.    MAU Course  Procedures  Wet prep and GC/Chlamydia cultures done.  Results for orders placed during the hospital encounter of 10/29/11 (from the past 24 hour(s))  URINALYSIS, ROUTINE W REFLEX MICROSCOPIC     Status: Abnormal   Collection Time   10/29/11  3:10 PM      Component Value Range   Color, Urine YELLOW  YELLOW    APPearance CLEAR  CLEAR    Specific Gravity, Urine 1.020  1.005 - 1.030    pH 7.0  5.0 - 8.0  Glucose, UA NEGATIVE  NEGATIVE (mg/dL)   Hgb urine dipstick TRACE (*) NEGATIVE    Bilirubin Urine NEGATIVE  NEGATIVE    Ketones, ur NEGATIVE  NEGATIVE (mg/dL)   Protein, ur NEGATIVE  NEGATIVE (mg/dL)   Urobilinogen, UA 0.2  0.0 - 1.0 (mg/dL)   Nitrite NEGATIVE  NEGATIVE    Leukocytes, UA NEGATIVE  NEGATIVE   URINE MICROSCOPIC-ADD ON     Status: Abnormal   Collection Time   10/29/11  3:10 PM      Component Value Range   Squamous Epithelial / LPF FEW (*) RARE    WBC, UA 0-2  <3 (WBC/hpf)   RBC / HPF 0-2  <3 (RBC/hpf)   Bacteria, UA FEW (*) RARE   WET PREP, GENITAL     Status: Abnormal   Collection Time   10/29/11  3:50 PM       Component Value Range   Yeast Wet Prep HPF POC NONE SEEN  NONE SEEN    Trich, Wet Prep NONE SEEN  NONE SEEN    Clue Cells Wet Prep HPF POC NONE SEEN  NONE SEEN    WBC, Wet Prep HPF POC FEW (*) NONE SEEN    US shows single living IUP with an EGA of 9.0wks and an EDC of 05/31/12.  Small fibroid noted. Rt simple ovarian cyst noted.  Assessment and Plan  IUP: discussed with pt at length. All communication performed with help from interpreter. Pt is to begin prenatal care. Discussed diet, activity, risks, and precautions.  Clinton Gallant. Vasilios Ottaway III, DrHSc, MPAS, PA-C  10/29/2011, 3:08 PM   Henrietta Hoover, PA 10/29/11 1608

## 2011-10-29 NOTE — Progress Notes (Signed)
Back in Dec had an [place], never found out the resutls.  [facility] sent her to the GYN clinic.  Is having pain on left side, was dx with fibroids about 4 yrs ago.  Went to clinic today because of the pain.  +preg test, was told to come up here.

## 2011-10-30 LAB — GC/CHLAMYDIA PROBE AMP, GENITAL
Chlamydia, DNA Probe: NEGATIVE
GC Probe Amp, Genital: NEGATIVE

## 2011-10-30 NOTE — Progress Notes (Signed)
Patient complaining of unilateral pain with incidental finding of pregnancy. Patient sent to MAU to r/o ectopic pregnancy

## 2011-12-02 LAB — SYPHILIS: RPR W/REFLEX TO RPR TITER AND TREPONEMAL ANTIBODIES, TRADITIONAL SCREENING AND DIAGNOSIS ALGORITHM: RPR: NONREACTIVE

## 2011-12-02 LAB — CBC
HCT: 42 % (ref 36–46)
Hemoglobin: 13 g/dL (ref 12.0–16.0)
Platelets: 166 10*3/uL (ref 150–399)

## 2011-12-02 LAB — CYTOLOGY - PAP: Pap Smear: NORMAL

## 2011-12-02 LAB — GC/CHLAMYDIA PROBE AMP, GENITAL
Chlamydia: NEGATIVE
Gonorrhea: NEGATIVE

## 2011-12-02 LAB — HIV ANTIBODY (ROUTINE TESTING W REFLEX): HIV: NONREACTIVE

## 2011-12-02 LAB — GLUCOSE TOLERANCE, 1 HOUR: Glucose, GTT - 1 Hour: 183 mg/dL (ref ?–200)

## 2011-12-04 LAB — GLUCOSE TOLERANCE, 3 HOURS
Glucose, GTT - 1 Hour: 166 mg/dL (ref ?–200)
Glucose, GTT - 2 Hour: 140 mg/dL (ref ?–140)
Glucose, GTT - 3 Hour: 63 mg/dL (ref ?–140)

## 2011-12-08 DIAGNOSIS — Z8632 Personal history of gestational diabetes: Secondary | ICD-10-CM | POA: Insufficient documentation

## 2011-12-08 DIAGNOSIS — O09529 Supervision of elderly multigravida, unspecified trimester: Secondary | ICD-10-CM | POA: Insufficient documentation

## 2011-12-08 DIAGNOSIS — O09299 Supervision of pregnancy with other poor reproductive or obstetric history, unspecified trimester: Secondary | ICD-10-CM

## 2011-12-08 NOTE — Progress Notes (Signed)
Pt had flu shot [facility] on 12/02/11.

## 2011-12-10 ENCOUNTER — Encounter: Payer: Self-pay | Admitting: Obstetrics & Gynecology

## 2011-12-10 ENCOUNTER — Ambulatory Visit (INDEPENDENT_AMBULATORY_CARE_PROVIDER_SITE_OTHER): Payer: Self-pay | Admitting: Obstetrics & Gynecology

## 2011-12-10 VITALS — BP 116/67 | Temp 98.6°F | Wt 151.5 lb

## 2011-12-10 DIAGNOSIS — O9989 Other specified diseases and conditions complicating pregnancy, childbirth and the puerperium: Secondary | ICD-10-CM

## 2011-12-10 DIAGNOSIS — Z889 Allergy status to unspecified drugs, medicaments and biological substances status: Secondary | ICD-10-CM

## 2011-12-10 DIAGNOSIS — Z9889 Other specified postprocedural states: Secondary | ICD-10-CM

## 2011-12-10 DIAGNOSIS — Z8751 Personal history of pre-term labor: Secondary | ICD-10-CM

## 2011-12-10 DIAGNOSIS — O26899 Other specified pregnancy related conditions, unspecified trimester: Secondary | ICD-10-CM

## 2011-12-10 DIAGNOSIS — N898 Other specified noninflammatory disorders of vagina: Secondary | ICD-10-CM

## 2011-12-10 DIAGNOSIS — O09219 Supervision of pregnancy with history of pre-term labor, unspecified trimester: Secondary | ICD-10-CM

## 2011-12-10 DIAGNOSIS — Z98891 History of uterine scar from previous surgery: Secondary | ICD-10-CM

## 2011-12-10 LAB — POCT URINALYSIS DIP (DEVICE)
Bilirubin Urine: NEGATIVE
Ketones, ur: NEGATIVE mg/dL
Leukocytes, UA: NEGATIVE
Nitrite: NEGATIVE
pH: 6 (ref 5.0–8.0)

## 2011-12-10 LAB — WET PREP, GENITAL: Clue Cells Wet Prep HPF POC: NONE SEEN

## 2011-12-10 MED ORDER — TRIAMCINOLONE ACETONIDE(NASAL) 55 MCG/ACT NA INHA: 1.0000 | Freq: Two times a day (BID) | NASAL | Status: DC

## 2011-12-10 NOTE — Progress Notes (Signed)
Addended by: Sherre Lain A on: 12/10/2011 04:56 PM   Modules accepted: Orders

## 2011-12-10 NOTE — Progress Notes (Signed)
Subjective:    Sarah Massey is a [id number] [redacted]w[redacted]d being seen today for her first obstetrical visit.  Her obstetrical history is significant for advanced maternal age and previous cesarean section, previous diabetes, previous premature deliveries. Patient does intend to breast feed. Pregnancy history fully reviewed.  Patient reports nasal allergies, tinnitis.  Filed Vitals:   12/10/11 0803  BP: 116/67  Temp: 98.6 F (37 C)  Weight: 68.72 kg (151 lb 8 oz)    HISTORY: OB History    Grav Para Term Preterm Abortions TAB SAB Ect Mult Living   9 5 3 2 3  0 3 0 0 5     # Outc Date GA Lbr Len/2nd Wgt Sex Del Anes PTL Lv   1 TRM 6/89 107w0d  11lb(4.99kg) M SVD None No Yes   2 SAB 1990 [redacted]w[redacted]d          3 TRM 8/98 [redacted]w[redacted]d  8lb(3.629kg) M SVD None No Yes   4 TRM 3/99 [redacted]w[redacted]d  8lb8oz(3.856kg) M SVD None No Yes   5 SAB 2003 [redacted]w[redacted]d      No    Comments: D&C   6 PRE 7/05 [redacted]w[redacted]d  3lb(1.361kg) M LTCS EPI No Yes   Comments: 36wk-gest diab Premature rupture of membranes   7 SAB 2008 [redacted]w[redacted]d          8 PRE 1/10 [redacted]w[redacted]d    VBAC EPI Yes Yes   Comments: induced-PROM   9 CUR              Past Medical History  Diagnosis Date  . Gestational diabetes   . Asthma   . Urinary tract infection   . Fibroid   . PONV (postoperative nausea and vomiting)   . Depression     pt is depressed about her current pregnancy   Past Surgical History  Procedure Date  . Dilation and curettage of uterus   . Cesarean section    Family History  Problem Relation Age of Onset  . Anesthesia problems Neg Hx      Exam    Uterine Size: size greater than dates 18 weeks  Pelvic Exam:    Perineum: No Hemorrhoids   Vulva: normal   Vagina:  normal mucosa   pH:    Cervix: no lesions   Adnexa: normal adnexa   Bony Pelvis: gynecoid  System: Breast:  normal appearance, no masses or tenderness   Skin: normal coloration and turgor, no rashes    Neurologic: oriented, normal   Extremities: normal strength, tone, and muscle mass   HEENT    Mouth/Teeth mucous membranes moist, pharynx normal without lesions   Neck supple   Cardiovascular: regular rate and rhythm   Respiratory:  appears well, vitals normal, no respiratory distress, acyanotic, normal RR, ear and throat exam is normal, neck free of mass or lymphadenopathy, chest clear, no wheezing, crepitations, rhonchi, normal symmetric air entry   Abdomen: soft, non-tender; bowel sounds normal; no masses,  no organomegaly   Urinary: urethral meatus normal      Assessment:    Pregnancy: [id number] Patient Active Problem List  Diagnoses  . AMA (advanced maternal age) multigravida 35+  . History of gestational diabetes in prior pregnancy, currently pregnant  . H/O premature delivery  . H/O cesarean section       Pt had normal 3hr GTT last week Plan:  Nasacort for allergies   Initial labs drawn. Prenatal vitamins. Problem list reviewed and updated. Genetic Screening discussed Quad Screen and Amniocentesis: undecided.  Ultrasound discussed; fetal survey: requested.  Follow up in 1 weeks. 50% of 35 min visit spent on counseling and coordination of care.  Need to begin 17P   Kacy Hegna 12/10/2011

## 2011-12-10 NOTE — Progress Notes (Signed)
U/S scheduled December 31, 2011 at 11 am.

## 2011-12-10 NOTE — Patient Instructions (Signed)
Prevencin de Sport and exercise psychologist (Preventing Preterm Labor) Un parto prematuro ocurre cuando la mujer embarazada tiene contracciones uterinas que causan la apertura, el acortamiento y el afinamiento del cuello del tero, antes de las 37 semanas de [place]. Tendr contracciones regulares cada 2 a 3 minutos. Esto generalmente causa molestias o dolor. CUIDADOS EN EL HOGAR  Consuma una dieta saludable.   [employer] las vitaminas segn le haya indicado el mdico.   Beba una cantidad de lquido suficiente como para Pharmacologist la orina de tono claro o color amarillo plido todos [place].   Descanse y duerma.   No tenga relaciones sexuales si tiene un parto prematuro o alto riesgo de tenerlo.   Siga las instrucciones del mdico acerca de su [place], los medicamentos y los exmenes.   Evite el estrs.   Evite los esfuerzos extenuantes o la actividad fsica extensa.   No fume.  SOLICITE AYUDA DE INMEDIATO SI:   Tiene contracciones.   Siente dolor abdominal.   Tiene sangrado que proviene de la vagina.   Siente dolor al [employer].   Observa una secrecin anormal que proviene de la vagina.   Tiene una temperatura oral de ms de 102 F (38.9 C).  ASEGRESE DE QUE:  Comprende estas instrucciones.   Controlar su enfermedad.   Solicitar ayuda si no mejora o si empeora.  Document Released: 10/03/2010 Document Revised: 08/20/2011 [facility] Patient Information 2012 [place], [place].

## 2011-12-10 NOTE — Progress Notes (Signed)
Pulse 83 Has a yellow foul smelling discharge.

## 2011-12-10 NOTE — Progress Notes (Signed)
Addended by: Sherre Lain A on: 12/10/2011 12:03 PM   Modules accepted: Orders

## 2011-12-11 LAB — OBSTETRIC PANEL
Basophils Absolute: 0 10*3/uL (ref 0.0–0.1)
Basophils Relative: 0 % (ref 0–1)
HCT: 39 % (ref 36.0–46.0)
Hemoglobin: 12.9 g/dL (ref 12.0–15.0)
Hepatitis B Surface Ag: NEGATIVE
Lymphocytes Relative: 41 % (ref 12–46)
MCHC: 33.1 g/dL (ref 30.0–36.0)
Monocytes Absolute: 0.3 10*3/uL (ref 0.1–1.0)
Neutro Abs: 3 10*3/uL (ref 1.7–7.7)
Neutrophils Relative %: 51 % (ref 43–77)
RDW: 13.1 % (ref 11.5–15.5)
Rubella: >500 IU/mL — ABNORMAL HIGH
WBC: 5.9 10*3/uL (ref 4.0–10.5)

## 2011-12-17 ENCOUNTER — Ambulatory Visit (INDEPENDENT_AMBULATORY_CARE_PROVIDER_SITE_OTHER): Payer: Self-pay | Admitting: *Deleted

## 2011-12-17 DIAGNOSIS — O09219 Supervision of pregnancy with history of pre-term labor, unspecified trimester: Secondary | ICD-10-CM

## 2011-12-17 MED ORDER — HYDROXYPROGESTERONE CAPROATE 250 MG/ML IM OIL
250.0000 mg | TOPICAL_OIL | INTRAMUSCULAR | Status: AC
  Administered 2011-12-24 – 2012-05-05 (×20): 250 mg via INTRAMUSCULAR

## 2011-12-17 MED ORDER — FLUTICASONE PROPIONATE 50 MCG/ACT NA SUSP: 2.0000 | Freq: Every day | NASAL | Status: DC

## 2011-12-22 DIAGNOSIS — O09529 Supervision of elderly multigravida, unspecified trimester: Secondary | ICD-10-CM

## 2011-12-22 DIAGNOSIS — O09299 Supervision of pregnancy with other poor reproductive or obstetric history, unspecified trimester: Secondary | ICD-10-CM

## 2011-12-24 ENCOUNTER — Ambulatory Visit (INDEPENDENT_AMBULATORY_CARE_PROVIDER_SITE_OTHER): Payer: Self-pay | Admitting: Obstetrics and Gynecology

## 2011-12-24 VITALS — BP 126/71 | HR 92 | Wt 151.4 lb

## 2011-12-24 DIAGNOSIS — Z8751 Personal history of pre-term labor: Secondary | ICD-10-CM

## 2011-12-24 DIAGNOSIS — O09219 Supervision of pregnancy with history of pre-term labor, unspecified trimester: Secondary | ICD-10-CM

## 2011-12-24 LAB — POCT URINALYSIS DIP (DEVICE)
Bilirubin Urine: NEGATIVE
Glucose, UA: NEGATIVE mg/dL
Nitrite: NEGATIVE
Urobilinogen, UA: 0.2 mg/dL (ref 0.0–1.0)

## 2011-12-31 ENCOUNTER — Ambulatory Visit (INDEPENDENT_AMBULATORY_CARE_PROVIDER_SITE_OTHER): Payer: Self-pay | Admitting: *Deleted

## 2011-12-31 ENCOUNTER — Ambulatory Visit (HOSPITAL_COMMUNITY)
Admission: RE | Admit: 2011-12-31 | Discharge: 2011-12-31 | Disposition: A | Discharge status: Home / Self Care | Payer: Self-pay | Source: Ambulatory Visit | Attending: Obstetrics & Gynecology | Admitting: Obstetrics & Gynecology

## 2011-12-31 VITALS — BP 114/69 | HR 84 | Temp 98.2°F

## 2011-12-31 DIAGNOSIS — Z889 Allergy status to unspecified drugs, medicaments and biological substances status: Secondary | ICD-10-CM

## 2011-12-31 DIAGNOSIS — O09299 Supervision of pregnancy with other poor reproductive or obstetric history, unspecified trimester: Secondary | ICD-10-CM

## 2011-12-31 DIAGNOSIS — O34219 Maternal care for unspecified type scar from previous cesarean delivery: Secondary | ICD-10-CM | POA: Insufficient documentation

## 2011-12-31 DIAGNOSIS — Z1389 Encounter for screening for other disorder: Secondary | ICD-10-CM | POA: Insufficient documentation

## 2011-12-31 DIAGNOSIS — O358XX0 Maternal care for other (suspected) fetal abnormality and damage, not applicable or unspecified: Secondary | ICD-10-CM | POA: Insufficient documentation

## 2011-12-31 DIAGNOSIS — O09529 Supervision of elderly multigravida, unspecified trimester: Secondary | ICD-10-CM

## 2011-12-31 DIAGNOSIS — Z98891 History of uterine scar from previous surgery: Secondary | ICD-10-CM

## 2011-12-31 DIAGNOSIS — Z8751 Personal history of pre-term labor: Secondary | ICD-10-CM | POA: Insufficient documentation

## 2011-12-31 DIAGNOSIS — O09219 Supervision of pregnancy with history of pre-term labor, unspecified trimester: Secondary | ICD-10-CM

## 2011-12-31 DIAGNOSIS — Z363 Encounter for antenatal screening for malformations: Secondary | ICD-10-CM | POA: Insufficient documentation

## 2011-12-31 DIAGNOSIS — Z8632 Personal history of gestational diabetes: Secondary | ICD-10-CM

## 2012-01-07 ENCOUNTER — Ambulatory Visit (INDEPENDENT_AMBULATORY_CARE_PROVIDER_SITE_OTHER): Payer: Medicaid Other | Admitting: Advanced Practice Midwife

## 2012-01-07 VITALS — BP 125/75 | Temp 96.6°F | Wt 152.6 lb

## 2012-01-07 DIAGNOSIS — O09299 Supervision of pregnancy with other poor reproductive or obstetric history, unspecified trimester: Secondary | ICD-10-CM

## 2012-01-07 DIAGNOSIS — O2340 Unspecified infection of urinary tract in pregnancy, unspecified trimester: Secondary | ICD-10-CM

## 2012-01-07 DIAGNOSIS — N390 Urinary tract infection, site not specified: Secondary | ICD-10-CM

## 2012-01-07 DIAGNOSIS — D259 Leiomyoma of uterus, unspecified: Secondary | ICD-10-CM

## 2012-01-07 DIAGNOSIS — O2390 Unspecified genitourinary tract infection in pregnancy, unspecified trimester: Secondary | ICD-10-CM

## 2012-01-07 DIAGNOSIS — O09219 Supervision of pregnancy with history of pre-term labor, unspecified trimester: Secondary | ICD-10-CM

## 2012-01-07 DIAGNOSIS — Z8751 Personal history of pre-term labor: Secondary | ICD-10-CM

## 2012-01-07 DIAGNOSIS — Z609 Problem related to social environment, unspecified: Secondary | ICD-10-CM

## 2012-01-07 DIAGNOSIS — Z789 Other specified health status: Secondary | ICD-10-CM

## 2012-01-07 DIAGNOSIS — O09899 Supervision of other high risk pregnancies, unspecified trimester: Secondary | ICD-10-CM

## 2012-01-07 DIAGNOSIS — Z758 Other problems related to medical facilities and other health care: Secondary | ICD-10-CM | POA: Insufficient documentation

## 2012-01-07 DIAGNOSIS — Z202 Contact with and (suspected) exposure to infections with a predominantly sexual mode of transmission: Secondary | ICD-10-CM

## 2012-01-07 DIAGNOSIS — Z641 Problems related to multiparity: Secondary | ICD-10-CM

## 2012-01-07 DIAGNOSIS — O09529 Supervision of elderly multigravida, unspecified trimester: Secondary | ICD-10-CM

## 2012-01-07 LAB — POCT URINALYSIS DIP (DEVICE)
Bilirubin Urine: NEGATIVE
Glucose, UA: NEGATIVE mg/dL
Ketones, ur: NEGATIVE mg/dL
Leukocytes, UA: NEGATIVE
Nitrite: NEGATIVE
pH: 5.5 (ref 5.0–8.0)

## 2012-01-07 MED ORDER — NITROFURANTOIN MONOHYD MACRO 100 MG PO CAPS: 100.0000 mg | ORAL_CAPSULE | Freq: Two times a day (BID) | ORAL | Status: DC

## 2012-01-07 NOTE — Patient Instructions (Signed)
Embarazo - Segundo trimestre (Pregnancy - Second Trimester) El segundo trimestre del embarazo (del 3 al 6mes) es un perodo de evolucin rpida para usted y el beb. Hacia el final del sexto mes, el beb mide aproximadamente 23 cm y pesa 680 g. Comenzar a sentir los movimientos del beb entre las 18 y las 20 semanas de embarazo. Podr sentir las pataditas ("quickening en ingls"). Hay un rpido aumento de peso. Puede segregar un lquido claro (calostro) de las mamas. Quizs sienta pequeas contracciones en el vientre (tero) Esto se conoce como falso trabajo de parto o contracciones de Braxton-Hicks. Es como una prctica del trabajo de parto que se produce cuando el beb est listo para salir. Generalmente los problemas de vmitos matinales ya se han superado hacia el final del primer trimestre. Algunas mujeres desarrollan pequeas manchas oscuras (que se denominan cloasma, mscara del embarazo) en la cara que normalmente se van luego del nacimiento del beb. La exposicin al sol empeora las manchas. Puede desarrollarse acn en algunas mujeres embarazadas, y puede desaparecer en aquellas que ya tienen acn. EXAMENES PRENATALES  Durante los exmenes prenatales, deber seguir realizando pruebas de sangre, segn avance el embarazo. Estas pruebas se realizan para controlar su salud y la del beb. Tambin se realizan anlisis de sangre para conocer los niveles de hemoglobina. La anemia (bajo nivel de hemoglobina) es frecuente durante el embarazo. Para prevenirla, se administran hierro y vitaminas. Tambin se le realizarn exmenes para saber si tiene diabetes entre las 24 y las 28 semanas del embarazo. Podrn repetirle algunas de las pruebas que le hicieron previamente.  En cada visita le medirn el tamao del tero. Esto se realiza para asegurarse de que el beb est creciendo correctamente de acuerdo al estado del embarazo.  Tambin en cada visita prenatal controlarn su presin arterial. Esto se realiza  para asegurarse de que no tenga toxemia.  Se controlar su orina para asegurarse de que no tenga infecciones, diabetes o protena en la orina.  Se controlar su peso regularmente para asegurarse que el aumento ocurre al ritmo indicado. Esto se hace para asegurarse que usted y el beb tienen una evolucin normal.  En algunas ocasiones se realiza una prueba de ultrasonido para confirmar el correcto desarrollo y evolucin del beb. Esta prueba se realiza con ondas sonoras inofensivas para el beb, de modo que el profesional pueda calcular ms precisamente la fecha del parto. Algunas veces se realizan pruebas especializadas del lquido amnitico que rodea al beb. Esta prueba se denomina amniocentesis. El lquido amnitico se obtiene introduciendo una aguja en el vientre (abdomen). Se realiza para controlar los cromosomas en aquellos casos en los que existe alguna preocupacin acerca de algn problema gentico que pueda sufrir el beb. En ocasiones se lleva a cabo cerca del final del embarazo, si es necesario inducir al parto. En este caso se realiza para asegurarse que los pulmones del beb estn lo suficientemente maduros como para que pueda vivir fuera del tero. CAMBIOS QUE OCURREN EN EL SEGUNDO TRIMESTRE DEL EMBARAZO Su organismo atravesar numerosos cambios durante el embarazo. Estos pueden variar de una persona a otra. Converse con el profesional que la asiste acerca los cambios que usted note y que la preocupen.  Durante el segundo trimestre probablemente sienta un aumento del apetito. Es normal tener "antojos" de ciertas comidas. Esto vara de una persona a otra y de un embarazo a otro.  El abdomen inferior comenzar a abultarse.  Podr tener la necesidad de orinar con ms frecuencia debido a que   el tero y el beb presionan sobre la vejiga. Tambin es frecuente contraer ms infecciones urinarias durante el embarazo (dolor al orinar). Puede evitarlas bebiendo gran cantidad de lquidos y vaciando  la vejiga antes y despus de mantener relaciones sexuales.  Podrn aparecer las primeras estras en las caderas, abdomen y mamas. Estos son cambios normales del cuerpo durante el embarazo. No existen medicamentos ni ejercicios que puedan prevenir estos cambios.  Es posible que comience a desarrollar venas inflamadas y abultadas (varices) en las piernas. El uso de medias de descanso, elevar sus pies durante 15 minutos, 3 a 4 veces al da y limitar la sal en su dieta ayuda a aliviar el problema.  Podr sentir acidez gstrica a medida que el tero crece y presiona contra el estmago. Puede tomar anticidos, con la autorizacin de su mdico, para aliviar este problema. Tambin es til ingerir pequeas comidas 4 a 5 veces al da.  La constipacin puede tratarse con un laxante o agregando fibra a su dieta. Beber grandes cantidades de lquidos, comer vegetales, frutas y granos integrales es de gran ayuda.  Tambin es beneficioso practicar actividad fsica. Si ha sido una persona activa hasta el embarazo, podr continuar con la mayora de las actividades durante el mismo. Si ha sido menos activa, puede ser beneficioso que comience con un programa de ejercicios, como realizar caminatas.  Puede desarrollar hemorroides (vrices en el recto) hacia el final del segundo trimestre. Tomar baos de asiento tibios y utilizar cremas recomendadas por el profesional que lo asiste sern de ayuda para los problemas de hemorroides.  Tambin podr sentir dolor de espalda durante este momento de su embarazo. Evite levantar objetos pesados, utilice zapatos de taco bajo y mantenga una buena postura para ayudar a reducir los problemas de espalda.  Algunas mujeres embarazadas desarrollan hormigueo y adormecimiento de la mano y los dedos debido a la hinchazn y compresin de los ligamentos de la mueca (sndrome del tnel carpiano). Esto desaparece una vez que el beb nace.  Como sus pechos se agrandan, necesitar un sujetador  ms grande. Use un sostn de soporte, cmodo y de algodn. No utilice un sostn para amamantar hasta el ltimo mes de embarazo si va a amamantar al beb.  Podr observar una lnea oscura desde el ombligo hacia la zona pbica denominada linea nigra.  Podr observar que sus mejillas se ponen coloradas debido al aumento de flujo sanguneo en la cara.  Podr desarrollar "araitas" en la cara, cuello y pecho. Esto desaparece una vez que el beb nace. INSTRUCCIONES PARA EL CUIDADO DOMICILIARIO  Es extremadamente importante que evite el cigarrillo, hierbas medicinales, alcohol y las drogas no prescriptas durante el embarazo. Estas sustancias qumicas afectan la formacin y el desarrollo del beb. Evite estas sustancias durante todo el embarazo para asegurar el nacimiento de un beb sano.  La mayor parte de los cuidados que se aconsejan son los mismos que los indicados para el primer trimestre del embarazo. Cumpla con las citas tal como se le indic. Siga las instrucciones del profesional que lo asiste con respecto al uso de los medicamentos, el ejercicio y la dieta.  Durante el embarazo debe obtener nutrientes para usted y para su beb. Consuma alimentos balanceados a intervalos regulares. Elija alimentos como carne, pescado, leche y otros productos lcteos descremados, vegetales, frutas, panes integrales y cereales. El profesional le informar cul es el aumento de peso ideal.  Las relaciones sexuales fsicas pueden continuarse hasta cerca del fin del embarazo si no existen otros problemas. Estos   problemas pueden ser la prdida temprana (prematura) de lquido amnitico de las membranas, sangrado vaginal, dolor abdominal u otros problemas mdicos o del embarazo.  Realice actividad fsica todos los das, si no tiene restricciones. Consulte con el profesional que la asiste si no sabe con certeza si determinados ejercicios son seguros. El mayor aumento de peso tiene lugar durante los ltimos 2 trimestres del  embarazo. El ejercicio la ayudar a:  Controlar su peso.  Ponerla en forma para el parto.  Ayudarla a perder peso luego de haber dado a luz.  Use un buen sostn o como los que se usan para hacer deportes para aliviar la sensibilidad de las mamas. Tambin puede serle til si lo usa mientras duerme. Si pierde calostro, podr utilizar apsitos en el sostn.  No utilice la baera con agua caliente, baos turcos y saunas durante el embarazo.  Utilice el cinturn de seguridad sin excepcin cuando conduzca. Este la proteger a usted y al beb en caso de accidente.  Evite comer carne cruda, queso crudo, y el contacto con los utensilios y desperdicios de los gatos. Estos elementos contienen grmenes que pueden causar defectos de nacimiento en el beb.  El segundo trimestre es un buen momento para visitar a su dentista y evaluar su salud dental si an no lo ha hecho. Es importante mantener los dientes limpios. Utilice un cepillo de dientes blando. Cepllese ms suavemente durante el embarazo.  Es ms fcil perder algo de orina durante el embarazo. Apretar y fortalecer los msculos de la pelvis la ayudar con este problema. Practique detener la miccin cuando est en el bao. Estos son los mismos msculos que necesita fortalecer. Son tambin los mismos msculos que utiliza cuando trata de evitar los gases. Puede practicar apretando estos msculos 10 veces, y repetir esto 3 veces por da aproximadamente. Una vez que conozca qu msculos debe apretar, no realice estos ejercicios durante la miccin. Puede favorecerle una infeccin si la orina vuelve hacia atrs.  Pida ayuda si tiene necesidades econmicas, de asesoramiento o nutricionales durante el embarazo. El profesional podr ayudarla con respecto a estas necesidades, o derivarla a otros especialistas.  La piel puede ponerse grasa. Si esto sucede, lvese la cara con un jabn suave, utilice un humectante no graso y maquillaje con base de aceite o  crema. CONSUMO DE MEDICAMENTOS Y DROGAS DURANTE EL EMBARAZO  Contine tomando las vitaminas apropiadas para esta etapa tal como se le indic. Las vitaminas deben contener un miligramo de cido flico y deben suplementarse con hierro. Guarde todas las vitaminas fuera del alcance de los nios. La ingestin de slo un par de vitaminas o tabletas que contengan hierro puede ocasionar la muerte en un beb o en un nio pequeo.  Evite el uso de medicamentos, inclusive los de venta libre y hierbas que no hayan sido prescriptos o indicados por el profesional que la asiste. Algunos medicamentos pueden causar problemas fsicos al beb. Utilice los medicamentos de venta libre o de prescripcin para el dolor, el malestar o la fiebre, segn se lo indique el profesional que lo asiste. No utilice aspirina.  El consumo de alcohol est relacionado con ciertos defectos de nacimiento. Esto incluye el sndrome de alcoholismo fetal. Debe evitar el consumo de alcohol en cualquiera de sus formas. El cigarrillo causa nacimientos prematuros y bebs de bajo peso. El uso de drogas recreativas est absolutamente prohibido. Son muy nocivas para el beb. Un beb que nace de una madre adicta, ser adicto al nacer. Ese beb tendr los mismos   causa nacimientos prematuros y bebs de [place]. El uso de drogas recreativas est absolutamente prohibido. Son muy nocivas para el beb. Un beb que nace de [employer], ser adicto al nacer. Ese beb tendr los mismos sntomas de abstinencia que un adulto.   Infrmele al profesional si consume alguna droga.   No consuma drogas ilegales. Pueden causarle mucho dao al beb.  SOLICITE ATENCIN MDICA SI: Tiene preguntas o preocupaciones durante su embarazo. Es mejor que llame para Science writer las dudas que esperar hasta su prxima visita prenatal. Thressa Sheller forma se sentir ms tranquila.  SOLICITE ATENCIN MDICA DE INMEDIATO SI:  La temperatura oral se eleva sin motivo por encima de 102 F (38.9 C) o segn le indique el profesional que lo asiste.   Tiene una prdida de lquido por la vagina (canal de parto). Si sospecha una ruptura de las [place], tmese la  temperatura y llame al profesional para informarlo sobre esto.   Observa unas pequeas manchas, una hemorragia vaginal o elimina cogulos. Notifique al profesional acerca de la cantidad y de cuntos apsitos est utilizando. Unas pequeas manchas de sangre son algo comn durante el Psychiatrist, especialmente despus de Sales promotion account executive.   Presenta un olor desagradable en la secrecin vaginal y observa un cambio en el color, de transparente a blanco.   Contina con las nuseas y no obtiene alivio de los remedios indicados. Vomita sangre o algo similar a la borra del caf.   [place] o sube ms de 900 g. en una semana, o segn lo indicado por el profesional que la asiste.   Observa que se le [employer], las manos, los pies o las piernas.   Ha estado expuesta a la rubola y no ha sufrido la enfermedad.   Ha estado expuesta a la quinta enfermedad o a la varicela.   Presenta dolor abdominal. Las molestias en el ligamento redondo son Neomia Dear causa no cancerosa (benigna) frecuente de dolor abdominal durante el embarazo. El profesional que la asiste deber evaluarla.   Presenta dolor de cabeza intenso que no se [place].   Presenta fiebre, diarrea, dolor al orinar o le falta la respiracin.   Presenta dificultad para ver, visin borrosa, o visin doble.   Sufre una cada, un accidente de trnsito o cualquier tipo de trauma.   Vive en un hogar en el que existe violencia fsica o mental.  Document Released: 06/10/2005 Document Revised: 08/20/2011 [facility] Patient Information 2012 [place], [place].  Infeccin del tracto urinario (Urinary Tract Infection) Las infecciones en el tracto urinario pueden comenzar en varios lugares. Una infeccin en la vejiga (cistitis), una infeccin en el rin (pielonefritis) o una infeccin en la prstata (prostatitis) son diferentes tipos de infeccin del tracto urinario. Por lo general mejoran si se los trata con antibiticos. Los antibiticos son  medicamentos que matan grmenes. [employer] medicamentos que le han recetado hasta que se terminen. Podr sentirse bien dentro de [address], pero DEBE TOMAR LOS MEDICAMENTOS HASTA TERMINAR EL TRATAMIENTO, de lo contrario la infeccin puede no solucionarse y luego ser ms difcil de Warehouse manager. INSTRUCCIONES PARA EL CUIDADO DOMICILIARIO  Beba gran cantidad de lquidos para mantener la orina de tono claro o color amarillo plido. Se recomienda especialmente el jugo de arndanos rojos, adems de grandes cantidades de [place].   Evite la cafena, el t y las bebidas con gas. Estas sustancias irritan la vejiga.   El alcohol puede Teacher, adult education.   Utilice los medicamentos de venta [place] o de  prescripcin para el dolor, el malestar o la fiebre, segn se lo indique el profesional que lo asiste.  PARA PREVENIR FUTURAS INFECCIONES:  Vace la vejiga con frecuencia. Evite retener la orina durante largos perodos.   Despus de mover el intestino, las mujeres deben higienizarse la regin perineal desde adelante hacia atrs. Use cada papel tissue slo una vez.   Vace la vejiga antes y despus de Management consultant.  OBTENER LOS RESULTADOS DE LAS PRUEBAS Durante su visita no contar con todos los [employer]. En este caso, tenga otra entrevista con su mdico para conocerlos. No piense que el resultado es normal si no tiene noticias de su mdico o de la institucin mdica. Es Copy seguimiento de todos los [place].  SOLICITE ATENCIN MDICA SI:  Siente dolor en la espalda.   El beb tiene ms de 3 meses y su temperatura rectal es de 100.5 F (38.1 C) o ms durante ms de 1 da.   Los problemas (sntomas) no mejoran en 3 das. Solicite atencin mdica antes si empeora.  SOLICITE ATENCIN MDICA DE INMEDIATO SI:  Comienza a sentir un dolor de espaldas o en la zona abdominal inferior intenso.   Comienza a sentir escalofros.   Tiene fiebre.   Su  beb tiene ms de 3 meses y su temperatura rectal es de 102 F (38.9 C) o mayor.   Su beb tiene 3 meses o menos y su temperatura rectal es de 100.4 F (38 C) o mayor.   Siente nuseas o vmitos.   Tiene una sensacin continua de quemazn o molestias al [employer].  EST SEGURO QUE:  Comprende las instrucciones para el alta mdica.   Controlar su enfermedad.   Solicitar atencin mdica de inmediato segn las indicaciones.  Document Released: 06/10/2005 Document Revised: 08/20/2011 [facility] Patient Information 2012 [place], [place].

## 2012-01-07 NOTE — Progress Notes (Signed)
Anatomy scan incomplete, otherwise normal. F/U US in 3 weeks. Reports frequency. Tr blood in urine, Will Rx Macrobid and culture urine.

## 2012-01-07 NOTE — Progress Notes (Signed)
Pulse 80. C/o increase in frequency in urination.

## 2012-01-07 NOTE — Progress Notes (Signed)
U/S scheduled Jan 28, 2012 at 930 am.

## 2012-01-09 LAB — CULTURE, OB URINE
Colony Count: NO GROWTH
Organism ID, Bacteria: NO GROWTH

## 2012-01-12 DIAGNOSIS — O09899 Supervision of other high risk pregnancies, unspecified trimester: Secondary | ICD-10-CM | POA: Insufficient documentation

## 2012-01-14 ENCOUNTER — Ambulatory Visit (INDEPENDENT_AMBULATORY_CARE_PROVIDER_SITE_OTHER): Payer: Self-pay

## 2012-01-14 VITALS — BP 100/61 | HR 85

## 2012-01-14 DIAGNOSIS — O2340 Unspecified infection of urinary tract in pregnancy, unspecified trimester: Secondary | ICD-10-CM

## 2012-01-14 DIAGNOSIS — N390 Urinary tract infection, site not specified: Secondary | ICD-10-CM

## 2012-01-14 DIAGNOSIS — O09529 Supervision of elderly multigravida, unspecified trimester: Secondary | ICD-10-CM

## 2012-01-14 DIAGNOSIS — Z8751 Personal history of pre-term labor: Secondary | ICD-10-CM

## 2012-01-14 DIAGNOSIS — O2390 Unspecified genitourinary tract infection in pregnancy, unspecified trimester: Secondary | ICD-10-CM

## 2012-01-14 DIAGNOSIS — O09219 Supervision of pregnancy with history of pre-term labor, unspecified trimester: Secondary | ICD-10-CM

## 2012-01-14 MED ORDER — NITROFURANTOIN MONOHYD MACRO 100 MG PO CAPS: 100.0000 mg | ORAL_CAPSULE | Freq: Two times a day (BID) | ORAL | Status: AC

## 2012-01-21 ENCOUNTER — Ambulatory Visit (INDEPENDENT_AMBULATORY_CARE_PROVIDER_SITE_OTHER): Payer: Self-pay | Admitting: *Deleted

## 2012-01-21 VITALS — BP 107/62 | HR 88 | Temp 98.0°F

## 2012-01-21 DIAGNOSIS — Z8751 Personal history of pre-term labor: Secondary | ICD-10-CM

## 2012-01-21 DIAGNOSIS — O09219 Supervision of pregnancy with history of pre-term labor, unspecified trimester: Secondary | ICD-10-CM

## 2012-01-25 ENCOUNTER — Encounter (HOSPITAL_COMMUNITY): Payer: Self-pay | Admitting: Physician Assistant

## 2012-01-25 ENCOUNTER — Inpatient Hospital Stay (HOSPITAL_COMMUNITY)
Admission: AD | Admit: 2012-01-25 | Discharge: 2012-01-25 | Disposition: A | Discharge status: Home / Self Care | Payer: Self-pay | Source: Ambulatory Visit | Attending: Obstetrics and Gynecology | Admitting: Obstetrics and Gynecology

## 2012-01-25 DIAGNOSIS — J45909 Unspecified asthma, uncomplicated: Secondary | ICD-10-CM | POA: Diagnosis present

## 2012-01-25 DIAGNOSIS — O99891 Other specified diseases and conditions complicating pregnancy: Secondary | ICD-10-CM | POA: Insufficient documentation

## 2012-01-25 MED ORDER — ALBUTEROL SULFATE (5 MG/ML) 0.5% IN NEBU: 2.5000 mg | INHALATION_SOLUTION | Freq: Once | RESPIRATORY_TRACT | Status: DC

## 2012-01-25 MED ORDER — ALBUTEROL SULFATE HFA 108 (90 BASE) MCG/ACT IN AERS: 2.0000 | INHALATION_SPRAY | RESPIRATORY_TRACT | Status: DC | PRN

## 2012-01-25 MED ORDER — METHYLPREDNISOLONE 4 MG PO KIT: PACK | ORAL | Status: DC

## 2012-01-25 MED ORDER — ALBUTEROL SULFATE (5 MG/ML) 0.5% IN NEBU
2.5000 mg | INHALATION_SOLUTION | Freq: Once | RESPIRATORY_TRACT | Status: AC
  Administered 2012-01-25: 2.5 mg via RESPIRATORY_TRACT
  Filled 2012-01-25: qty 0.5

## 2012-01-25 MED ORDER — IPRATROPIUM BROMIDE 0.02 % IN SOLN
0.5000 mg | Freq: Once | RESPIRATORY_TRACT | Status: AC
  Administered 2012-01-25: 0.5 mg via RESPIRATORY_TRACT

## 2012-01-25 MED ORDER — ALBUTEROL SULFATE (5 MG/ML) 0.5% IN NEBU
INHALATION_SOLUTION | RESPIRATORY_TRACT | Status: AC
  Filled 2012-01-25: qty 0.5

## 2012-01-25 MED ORDER — METHYLPREDNISOLONE 4 MG PO KIT: PACK | ORAL | Status: AC

## 2012-01-25 NOTE — MAU Provider Note (Signed)
Chief Complaint:  Respiratory Distress    First Provider Initiated Contact with Patient 01/25/12 0442      Sarah Massey is  41 y.o. [id number].  Patient's last menstrual period was 08/25/2011.. [redacted]w[redacted]d   She presents complaining of Respiratory Distress  Presents to MAU with asthma attack requesting breathing treatment. Report cold s/s with fever since Friday, out of Proventil inhaler. SOB became worse this morning which prompted visit to MAU.  Obstetrical/Gynecological History: OB History    Grav Para Term Preterm Abortions TAB SAB Ect Mult Living   9 5 3 2 3  0 3 0 0 5      Past Medical History: Past Medical History  Diagnosis Date  . Gestational diabetes   . Asthma   . Urinary tract infection   . Fibroid   . PONV (postoperative nausea and vomiting)   . Depression     pt is depressed about her current pregnancy  . Asthma 01/25/2012    Past Surgical History: Past Surgical History  Procedure Date  . Dilation and curettage of uterus   . Cesarean section     Family History: Family History  Problem Relation Age of Onset  . Anesthesia problems Neg Hx     Social History: History  Substance Use Topics  . Smoking status: Never Smoker   . Smokeless tobacco: Never Used  . Alcohol Use: No    Allergies: No Known Allergies  Prescriptions prior to admission  Medication Sig Dispense Refill  . Fexofenadine HCl (ALLEGRA PO) Take 1 tablet by mouth daily as needed. Patient used this medication for her allergies.      . fluticasone (FLONASE) 50 MCG/ACT nasal spray Place 2 sprays into the nose daily.  1 g  2  . Prenatal Vit-Fe Fumarate-FA (MULTIVITAMIN-PRENATAL) 27-0.8 MG TABS Take 1 tablet by mouth daily.      Marland Kitchen triamcinolone (NASACORT AQ) 55 MCG/ACT nasal inhaler Place 1 spray into the nose 2 (two) times daily.  1 Inhaler  2  . DISCONTD: albuterol (PROVENTIL HFA;VENTOLIN HFA) 108 (90 BASE) MCG/ACT inhaler Inhale 2 puffs into the lungs every 6 (six) hours as needed. Patient used  this medication to assist with breathing.      . nitrofurantoin, macrocrystal-monohydrate, (MACROBID) 100 MG capsule Take 1 capsule (100 mg total) by mouth 2 (two) times daily.  14 capsule  0  . nitrofurantoin, macrocrystal-monohydrate, (MACROBID) 100 MG capsule Take 1 capsule (100 mg total) by mouth 2 (two) times daily.  14 capsule  0    Review of Systems - History obtained from the patient and patient's son General ROS: positive for  - SOB negative for - chills, fatigue or fever ENT ROS: positive for - wheezing negative for - nasal congestion or nasal discharge Respiratory ROS: positive for - shortness of breath and wheezing negative for - cough Cardiovascular ROS: no chest pain or dyspnea on exertion Gastrointestinal ROS: no abdominal pain, change in bowel habits, or black or bloody stools Genito-Urinary ROS: no dysuria, trouble voiding, or hematuria  Physical Exam   Blood pressure 121/66, pulse 94, temperature 99 F (37.2 C), temperature source Oral, resp. rate 24, height 5' (1.524 m), weight 155 lb (70.308 kg), last menstrual period 08/25/2011, SpO2 94.00%.  General: General appearance - alert, well appearing, and in no distress, oriented to person, place, and time and overweight Mental status - alert, oriented to person, place, and time, normal mood, behavior, speech, dress, motor activity, and thought processes, affect appropriate to mood Eyes -  pupils equal and reactive, extraocular eye movements intact Ears - bilateral TM's and external ear canals normal Nose - mucosal congestion, clear rhinorrhea and sinuses normal and nontender Mouth - mucous membranes moist, pharynx normal without lesions Neck - supple, no significant adenopathy Lymphatics - no palpable lymphadenopathy, no hepatosplenomegaly Chest - wheezing noted throughout A&P Heart - normal rate, regular rhythm, normal S1, S2, no murmurs, rubs, clicks or gallops Abdomen - soft, nontender, nondistended, no masses or  organomegaly Back exam - full range of motion, no tenderness, palpable spasm or pain on motion Neurological - alert, oriented, normal speech, no focal findings or movement disorder noted, neck supple without rigidity, cranial nerves II through XII intact Extremities - peripheral pulses normal, no pedal edema, no clubbing or cyanosis Focused Gynecological Exam: examination not indicated  ED Course: Lungs clear to ausculation after nebs treatment Atrovent/albuteril.   Assessment: Asthmas [redacted]w[redacted]d   Plan: Discharge home Rx Steroid taper and Proventil inhaler refill RTC on Thursday as scheduled  Darienne Belleau E. 01/25/2012,5:16 AM

## 2012-01-25 NOTE — Discharge Instructions (Signed)
Prevencin del asma (Asthma Prevention) El humo del cigarrillo, el polvo domstico, los hongos, el polen, la descamacin de los [place], algunos insectos, la actividad fsica y [place] aire frio pueden ser disparadores de un episodio de asma. En algunos casos, la causa no se identifica.  Para disminuir la frecuencia de los ataques, siga los siguientes consejos que podr Education officer, environmental en su casa:  Evite el humo del cigarrillo o el que provenga de otras fuentes. No permita que nadie fume en la casa que habita un enfermo de asma. Si se permite fumar en el interior, deber hacerse en una habitacin en la que se cierre la puerta. Posteriormente, deber abrirse una ventana para ventilar el aire. En lo posible, no utilice un horno a lea, una estufa a querosene o un hogar de lea. Minimice la exposicin a toda fuente de humo, inclusive incienso, velas, fogatas o fuegos artificiales.   Disminuya la exposicin al polvo. Mantenga las ventanas cerradas y [employer] aire acondicionado central durante la temporada de alergia al polen. En lo posible, permanezca dentro de la habitacin con las ventanas cerradas desde las ltimas horas de la maana hasta la tarde. Evite cortar el csped si el polen le causa alergia. Mdese de ropa y tome una ducha despus de estar en el exterior durante esta poca del ao.   Elimine el moho de los baos y zonas hmedas. Hgalo limpiando los pisos con un fungicida o con lavandina diluida. Evite el uso de humidificadores, vaporizadores o refrigerantes hmedos. Pueden diseminar el moho a travs del aire. Arregle las caeras que pierdan agua u otras fuentes de agua que tengan moho alrededor.   Disminuya la exposicin al polvo de la casa. Hgalo dejando los pisos desocupados, aspirndolos con frecuencia y [employer] filtros de las calderas y los acondicionadores de aire con Psychologist, clinical. Evite el uso de elementos de cama de plumas, lana o espuma de goma. Utilice almohadas de Equities trader y  cobertores de [employer] colchones. Lave la ropa de cama semanalmente en agua caliente (ms caliente que 130 F).   Trate de pedirle a otra persona que realice el aspirado una o dos veces por semana. Permanezca fuera de las habitaciones mientras son aspiradas y por algn tiempo despus. Si el aspirado lo realiza usted, use una mascarilla para el polvo (consgala en una ferretera), [place] bolsa para la aspiradora doble o de [place], o una aspiradora con un filtro HEPA.   Evite los perfumes, el talco, el aerosol para el cabello, las pinturas y otros olores y vapores intensos.   Mantenga a las [address] de [address] caliente (gatos, perros, roedores, Engineer, mining) fuera de su casa, si ellos le provocan el asma. Si no puede mantener a las Engineer, mining, Engineer, water fuera de la habitacin y de otras reas en las que duerme, y Dietitian la puerta cerrada. Quite de su casa las alfombras y los muebles cubiertos con telas. Si eso no es posible, [address] las mascotas lejos de los muebles cubiertos de tela y de las alfombras.   Elimine las cucarachas. Mantenga los alimentos y las bebidas en contenedores cerrados. Nunca deje comida a la vista. Use venenos, tramperas polvos, geles o pastas (por ejemplo cido brico). Si [place] un aerosol para exterminar las cucarachas, permanezca fuera de la habitacin hasta que el olor desaparezca.   Disminuya la humedad del ambiente a menos del 60%. Use un purificador de aire.   Evite los sulfitos en alimentos y bebidas. No beba cerveza ni vino, ni consuma frutas  secas, patatas procesadas o langostinos, si estos le producen sntomas de asma.   Evite el aire fro. Cbrase la nariz y la boca con una bufanda en [address] fros o ventosos.   No consuma aspirina. Este es el medicamento que con ms frecuencia causa ataques de asma.   Si la actividad fsica le desencadena un ataque de asma, consulte a su mdico como debe prepararse antes de hacer ejercicio. (Por ejemplo,  pregunte si puede usar su inhalador 10 minutos antes de la [place]).   Evite el contacto cercano con personas que estn resfriadas o tienen gripe, ya que los sntomas de asma pueden empeorar si se contagia la infeccin. Lvese las manos cuidadosamente despus de tocar objetos manipulados por otras personas que sufren infecciones respiratorias.   Aplquese la vacuna contra la gripe todos los aos para protegerse contra el virus de la gripe, lo que con frecuencia hace que el asma empeore durante das o semanas. Tambin aplquese la vacuna contra la neumona una vez cada 5  10 aos.  Comunquese con su mdico si necesita ms informacin acerca de las medidas que puede tomar para prevenir los ataques de asma. Document Released: 08/31/2005 Document Revised: 08/20/2011 [facility] Patient Information 2012 [place], [place].Asma, broncoespasmo agudo (Asthma, Acute Bronchospasm) Sus exmenes confirman que usted sufre asma o broncoespasmo agudo, que es similar al asma. Broncoespasmo significa que las vas areas se han estrechado. Estas enfermedades se deben a la inflamacin y espasmo de las vas areas que producen el estrechamiento de los conductos bronquiales en los pulmones. Esto le produce ruidos como silbidos y falta de [place]. CAUSAS Infecciones respiratorias y [place] son las causas ms frecuentes de los ataques. El hbito de fumar, la polucin del aire, [place] fro, los problemas emocionales y la actividad fsica extenuante tambin pueden provocarlos.  TRATAMIENTO  El tratamiento est dirigido a Clinical biochemist las vas areas. El asma o broncoespasmo leves generalmente pueden controlarse con inhalantes. El Albuterol es un medicamento comn que se inhala para abrir las vas areas espsticas o Solicitor. Algunas marcas de albuterol son Ventolin o Proventil. Los corticoides tambin se utilizan para reducir la inflamacin cuando un ataque es moderado o grave. Puede ser necesario el uso de antibiticos  (medicamentos que destruyen grmenes) slo si hay infeccin.   Si est embarazada y IT trainer Albuterol (Ventolin or Proventil), puede esperar que el beb se mueva ms de lo habitual poco despus de Astronomer.  INSTRUCCIONES PARA EL CUIDADO DOMICILIARIO  Reposo.   Beba lquido en abundancia. Esto ayuda a Medical illustrator y a Pensions consultant. No ingiera cafena ni alcohol.   No fume. Evite la exposicin a humo de [employer].   Usted tiene un rol fundamental en mantener bien su salud. Evite la exposicin a lo que le causa los silbidos (sibilancias). Evite la exposicin a lo que [employer] respiratorios. Mantenga los medicamentos actualizados y a Designer, fashion/clothing. Siga cuidadosamente el plan de tratamiento que le indique su mdico.   Cuando haya mucho polen o polucin, mantenga las ventanas cerradas y use el aire acondicionado siempre que lo encuentre disponible. Si es alrgico a la piel de Neurosurgeon o pjaros, bsqueles un hogar sustituto o Loss adjuster, chartered.   Utilice los medicamentos tal como se le indic.   El asma requiere atencin [place]. Concurra a los controles segn las indicaciones. Si tiene Chartered loss adjuster de ms de 24 semanas y le han prescripto medicamentos nuevos, comntelo con su obstetra. Solicite turno para un  nuevo control.  SOLICITE ATENCIN MDICA DE INMEDIATO SI:  Siente que empeora.   Presenta dificultades respiratorias. Si son graves, comunquese al 911.   Siente dolor o International aid/development worker.   Vomita o no bebe lquidos.   No mejora dentro de las 24 horas.   El nio tiene un catarro verde, [place], amarronado o esputo sanguinolento.   Presenta una temperatura persistente superior a 102 F (38.9 C).   Presenta dificultad para tragar.  ASEGRESE QUE:   Comprende esas instrucciones.   Controlar su enfermedad.   Solicitar ayuda de inmediato si no mejora o si empeora.  Document Released: 12/17/2008 Document  Revised: 08/20/2011 [facility] Patient Information 2012 [place], [place].

## 2012-01-28 ENCOUNTER — Ambulatory Visit (INDEPENDENT_AMBULATORY_CARE_PROVIDER_SITE_OTHER): Payer: Self-pay

## 2012-01-28 ENCOUNTER — Ambulatory Visit (HOSPITAL_COMMUNITY)
Admission: RE | Admit: 2012-01-28 | Discharge: 2012-01-28 | Disposition: A | Discharge status: Home / Self Care | Payer: Self-pay | Source: Ambulatory Visit | Attending: Advanced Practice Midwife | Admitting: Advanced Practice Midwife

## 2012-01-28 VITALS — BP 106/66 | HR 73

## 2012-01-28 DIAGNOSIS — O09529 Supervision of elderly multigravida, unspecified trimester: Secondary | ICD-10-CM | POA: Insufficient documentation

## 2012-01-28 DIAGNOSIS — O09219 Supervision of pregnancy with history of pre-term labor, unspecified trimester: Secondary | ICD-10-CM

## 2012-01-28 DIAGNOSIS — O3410 Maternal care for benign tumor of corpus uteri, unspecified trimester: Secondary | ICD-10-CM | POA: Insufficient documentation

## 2012-01-28 DIAGNOSIS — Z8751 Personal history of pre-term labor: Secondary | ICD-10-CM | POA: Insufficient documentation

## 2012-01-28 DIAGNOSIS — O09299 Supervision of pregnancy with other poor reproductive or obstetric history, unspecified trimester: Secondary | ICD-10-CM | POA: Insufficient documentation

## 2012-02-04 ENCOUNTER — Ambulatory Visit (INDEPENDENT_AMBULATORY_CARE_PROVIDER_SITE_OTHER): Payer: Self-pay | Admitting: Physician Assistant

## 2012-02-04 ENCOUNTER — Ambulatory Visit: Payer: Self-pay

## 2012-02-04 VITALS — BP 113/57 | Temp 96.9°F | Wt 154.3 lb

## 2012-02-04 DIAGNOSIS — O09899 Supervision of other high risk pregnancies, unspecified trimester: Secondary | ICD-10-CM

## 2012-02-04 DIAGNOSIS — Z202 Contact with and (suspected) exposure to infections with a predominantly sexual mode of transmission: Secondary | ICD-10-CM

## 2012-02-04 DIAGNOSIS — O09219 Supervision of pregnancy with history of pre-term labor, unspecified trimester: Secondary | ICD-10-CM

## 2012-02-04 LAB — POCT URINALYSIS DIP (DEVICE)
Glucose, UA: NEGATIVE mg/dL
Leukocytes, UA: NEGATIVE
Specific Gravity, Urine: 1.01 (ref 1.005–1.030)
Urobilinogen, UA: 0.2 mg/dL (ref 0.0–1.0)

## 2012-02-04 NOTE — Progress Notes (Signed)
Pulse: 81

## 2012-02-04 NOTE — Progress Notes (Signed)
No complaints. + FM. No s/s of PTL. Reviewed [place] results.

## 2012-02-04 NOTE — Patient Instructions (Signed)
Parto prematuro  (Preterm Labor) El parto prematuro comienza antes de la semana 37 de embarazo. La duracin de un embarazo normal es de 39 a 41 semanas.  CAUSAS  Generalmente no hay una causa que pueda identificarse. Sin embargo, una de las causas conocidas ms frecuentes son las infecciones. Las infecciones del tero, el cuello, la vagina, el lquido amnitico, la vejiga, los riones y hasta de los pulmones (neumona) pueden hacer que el trabajo de parto se inicie. Otras causas son:   Infecciones urogenitales, como infecciones por hongos y vaginosis bacteriana.   Anormalidades uterinas (forma del tero, sptum uterino, fibromas, hemorragias en la placenta).   Un cuello que ha sido operado y se abre prematuramente.   Malformaciones del beb.   Gestaciones mltiples (mellizos, trillizos y ms).   Ruptura del saco amnitico.  :Otros factores de riesgo del parto prematuro son   Historia previa de parto prematuro.   Ruptura prematura de las membranas.   La placenta cubre la apertura del cuello (placenta previa).   La placenta se separa del tero (abrupcin placentaria).   El cuello es demasiado dbil para contener al beb en el tero (cuello incompetente).   Hay mucho lquido en el saco amnitico (polihidramnios).   Consumo de drogas o hbito de fumar durante el embarazo.   No aumentar de peso lo suficiente durante el embarazo.   Mujeres menores de 18 aos o mayores de 35 aos aos.   Nivel socioeconmico bajo.   Raza afroamericana.  SNTOMAS  Los signos y sntomas son:   Clicos del tipo menstrual   Contracciones con un intervalo entre 30 y 70 segundos, comienzan a ser regulares, se hacen ms frecuentes y se hacen ms intensas y dolorosas.   Contracciones que comienzan en la parte superior del tero y se expanden hacia abajo, hacia la zona inferior del abdomen y la espalda.   Sensacin de presin en la pelvis o dolor en la espalda.   Aparece una secrecin acuosa o  sanguinolenta por la vagina.  DIAGNSTICO  El diagnstico puede confirmarse:   Con un examen vaginal.   Ecografa del cuello.   Muestra (hisopado) de las secreciones crvico-vaginales. Estas muestras se analizan para buscar la presencia de fibronectina fetal. Esta protena que se encuentra en las secreciones del tero y se asocia con el parto prematuro.   Monitoreo fetal  TRATAMIENTO  Segn el tiempo del embarazo y otras circunstancias, el mdico puede indicar reposo en cama. Si es necesario, le indicarn medicamentos para detener las contracciones y apurar la maduracin de los pulmones del feto. Si el trabajo de parto se inicia antes de las 34 semanas de embarazo, se recomienda la hospitalizacin. El tratamiento depende de las condiciones en que se encuentre la madre y el beb.  PREVENCIN  Hay algunas cosas que una madre puede hacer para disminuir el riesgo de trabajo de parto prematuro en futuros embarazos. Una mam puede:   Dejar de fumar.   Mantener un peso saludable y evitar sustancias qumicas y drogas innecesarias.   Controlar todo tipo de infeccin.   Informar al mdico si tiene una historia conocida de parto prematuro.  Document Released: 12/08/2007 Document Revised: 08/20/2011 [employer] Patient Information 2012 [employer]. 

## 2012-02-08 NOTE — MAU Provider Note (Signed)
Attestation of Attending Supervision of Advanced Practitioner: Evaluation and management procedures were performed by the PA/NP/CNM/OB Fellow under my supervision/collaboration. Chart reviewed and agree with management and plan.  Maalik Pinn V 02/08/2012 8:08 PM

## 2012-02-11 ENCOUNTER — Ambulatory Visit (INDEPENDENT_AMBULATORY_CARE_PROVIDER_SITE_OTHER): Payer: Self-pay | Admitting: Obstetrics and Gynecology

## 2012-02-11 VITALS — BP 101/60 | HR 72 | Ht 60.0 in | Wt 155.3 lb

## 2012-02-11 DIAGNOSIS — Z641 Problems related to multiparity: Secondary | ICD-10-CM

## 2012-02-11 DIAGNOSIS — O09219 Supervision of pregnancy with history of pre-term labor, unspecified trimester: Secondary | ICD-10-CM

## 2012-02-11 DIAGNOSIS — Z8751 Personal history of pre-term labor: Secondary | ICD-10-CM

## 2012-02-18 ENCOUNTER — Ambulatory Visit (INDEPENDENT_AMBULATORY_CARE_PROVIDER_SITE_OTHER): Payer: Self-pay | Admitting: Physician Assistant

## 2012-02-18 VITALS — BP 108/62 | Temp 97.5°F | Wt 154.6 lb

## 2012-02-18 DIAGNOSIS — O09219 Supervision of pregnancy with history of pre-term labor, unspecified trimester: Secondary | ICD-10-CM

## 2012-02-18 DIAGNOSIS — Z8751 Personal history of pre-term labor: Secondary | ICD-10-CM

## 2012-02-18 LAB — POCT URINALYSIS DIP (DEVICE)
Bilirubin Urine: NEGATIVE
Glucose, UA: NEGATIVE mg/dL
Leukocytes, UA: NEGATIVE
Nitrite: NEGATIVE

## 2012-02-18 NOTE — Progress Notes (Signed)
No complaints. +FM. No s/s PTL. 17-p today. Will repeat 3 GTT at next visit.

## 2012-02-18 NOTE — Patient Instructions (Signed)
Parto prematuro  (Preterm Labor) El parto prematuro comienza antes de la semana 37 de embarazo. La duracin de un embarazo normal es de 39 a 41 semanas.  CAUSAS  Generalmente no hay una causa que pueda identificarse. Sin embargo, una de las causas conocidas ms frecuentes son las infecciones. Las infecciones del tero, el cuello, la vagina, el lquido amnitico, la vejiga, los riones y hasta de los pulmones (neumona) pueden hacer que el trabajo de parto se inicie. Otras causas son:   Infecciones urogenitales, como infecciones por hongos y vaginosis bacteriana.   Anormalidades uterinas (forma del tero, sptum uterino, fibromas, hemorragias en la placenta).   Un cuello que ha sido operado y se abre prematuramente.   Malformaciones del beb.   Gestaciones mltiples (mellizos, trillizos y ms).   Ruptura del saco amnitico.  :Otros factores de riesgo del parto prematuro son   Historia previa de parto prematuro.   Ruptura prematura de las membranas.   La placenta cubre la apertura del cuello (placenta previa).   La placenta se separa del tero (abrupcin placentaria).   El cuello es demasiado dbil para contener al beb en el tero (cuello incompetente).   Hay mucho lquido en el saco amnitico (polihidramnios).   Consumo de drogas o hbito de fumar durante el embarazo.   No aumentar de peso lo suficiente durante el embarazo.   Mujeres menores de 18 aos o mayores de 35 aos aos.   Nivel socioeconmico bajo.   Raza afroamericana.  SNTOMAS  Los signos y sntomas son:   Clicos del tipo menstrual   Contracciones con un intervalo entre 30 y 70 segundos, comienzan a ser regulares, se hacen ms frecuentes y se hacen ms intensas y dolorosas.   Contracciones que comienzan en la parte superior del tero y se expanden hacia abajo, hacia la zona inferior del abdomen y la espalda.   Sensacin de presin en la pelvis o dolor en la espalda.   Aparece una secrecin acuosa o  sanguinolenta por la vagina.  DIAGNSTICO  El diagnstico puede confirmarse:   Con un examen vaginal.   Ecografa del cuello.   Muestra (hisopado) de las secreciones crvico-vaginales. Estas muestras se analizan para buscar la presencia de fibronectina fetal. Esta protena que se encuentra en las secreciones del tero y se asocia con el parto prematuro.   Monitoreo fetal  TRATAMIENTO  Segn el tiempo del embarazo y otras circunstancias, el mdico puede indicar reposo en cama. Si es necesario, le indicarn medicamentos para detener las contracciones y apurar la maduracin de los pulmones del feto. Si el trabajo de parto se inicia antes de las 34 semanas de embarazo, se recomienda la hospitalizacin. El tratamiento depende de las condiciones en que se encuentre la madre y el beb.  PREVENCIN  Hay algunas cosas que una madre puede hacer para disminuir el riesgo de trabajo de parto prematuro en futuros embarazos. Una mam puede:   Dejar de fumar.   Mantener un peso saludable y evitar sustancias qumicas y drogas innecesarias.   Controlar todo tipo de infeccin.   Informar al mdico si tiene una historia conocida de parto prematuro.  Document Released: 12/08/2007 Document Revised: 08/20/2011 [employer] Patient Information 2012 [employer]. 

## 2012-02-18 NOTE — Progress Notes (Signed)
P= 86 Swelling in feet and hands in the am HA patient says occurs after taking PNV

## 2012-02-25 ENCOUNTER — Other Ambulatory Visit (INDEPENDENT_AMBULATORY_CARE_PROVIDER_SITE_OTHER): Payer: Self-pay | Admitting: *Deleted

## 2012-02-25 ENCOUNTER — Ambulatory Visit (INDEPENDENT_AMBULATORY_CARE_PROVIDER_SITE_OTHER): Payer: Self-pay | Admitting: Obstetrics and Gynecology

## 2012-02-25 VITALS — BP 105/63 | Temp 97.5°F | Wt 155.6 lb

## 2012-02-25 VITALS — BP 105/63

## 2012-02-25 DIAGNOSIS — O09219 Supervision of pregnancy with history of pre-term labor, unspecified trimester: Secondary | ICD-10-CM

## 2012-02-25 DIAGNOSIS — O09299 Supervision of pregnancy with other poor reproductive or obstetric history, unspecified trimester: Secondary | ICD-10-CM

## 2012-02-25 DIAGNOSIS — Z9889 Other specified postprocedural states: Secondary | ICD-10-CM

## 2012-02-25 DIAGNOSIS — N760 Acute vaginitis: Secondary | ICD-10-CM

## 2012-02-25 DIAGNOSIS — Z8751 Personal history of pre-term labor: Secondary | ICD-10-CM

## 2012-02-25 DIAGNOSIS — O09899 Supervision of other high risk pregnancies, unspecified trimester: Secondary | ICD-10-CM

## 2012-02-25 DIAGNOSIS — Z98891 History of uterine scar from previous surgery: Secondary | ICD-10-CM

## 2012-02-25 DIAGNOSIS — O09529 Supervision of elderly multigravida, unspecified trimester: Secondary | ICD-10-CM

## 2012-02-25 LAB — WET PREP, GENITAL: Trich, Wet Prep: NONE SEEN

## 2012-02-25 NOTE — Progress Notes (Signed)
Pulse 77 Pt has itching in vaginal area.

## 2012-02-25 NOTE — Progress Notes (Signed)
Patient scheduled for 17P and 3hr GCT but had a complaint of vaginal pruritis and white discharge. SSE: thin white discharge, No odor. Wet prep collected. FM/PTl precautions reviewed. Continue weekly 17P

## 2012-02-26 LAB — GLUCOSE TOLERANCE, 3 HOURS
Glucose Tolerance, 1 hour: 155 mg/dL (ref 70–189)
Glucose Tolerance, 2 hour: 136 mg/dL (ref 70–164)
Glucose Tolerance, Fasting: 78 mg/dL (ref 70–104)

## 2012-02-26 MED ORDER — METRONIDAZOLE 500 MG PO TABS: 500.0000 mg | ORAL_TABLET | Freq: Three times a day (TID) | ORAL | Status: DC

## 2012-02-26 NOTE — Addendum Note (Signed)
Addended by: Catalina Antigua on: 02/26/2012 08:43 AM   Modules accepted: Orders

## 2012-02-29 ENCOUNTER — Telehealth: Payer: Self-pay | Admitting: *Deleted

## 2012-02-29 NOTE — Telephone Encounter (Signed)
Message copied by Gerome Apley on Mon Feb 29, 2012  4:48 PM ------      Message from: Reva Bores      Created: Fri Feb 26, 2012  8:27 AM       nml 3 hour-inform pt.

## 2012-02-29 NOTE — Telephone Encounter (Signed)
Need to call patient with interpreter- see below

## 2012-03-01 NOTE — Telephone Encounter (Signed)
Pt informed that her 3hr glucose test was normal.  Pt also informed that she has BV and Rx has been sent to her pharmacy. Pt voiced understanding.

## 2012-03-03 ENCOUNTER — Encounter: Payer: Self-pay | Admitting: Obstetrics and Gynecology

## 2012-03-03 ENCOUNTER — Ambulatory Visit (INDEPENDENT_AMBULATORY_CARE_PROVIDER_SITE_OTHER): Payer: Self-pay

## 2012-03-03 VITALS — BP 117/67 | HR 78 | Ht 63.0 in | Wt 157.4 lb

## 2012-03-03 DIAGNOSIS — Z8751 Personal history of pre-term labor: Secondary | ICD-10-CM

## 2012-03-03 DIAGNOSIS — O09219 Supervision of pregnancy with history of pre-term labor, unspecified trimester: Secondary | ICD-10-CM

## 2012-03-03 MED ORDER — METRONIDAZOLE 500 MG PO TABS: 500.0000 mg | ORAL_TABLET | Freq: Three times a day (TID) | ORAL | Status: DC

## 2012-03-10 ENCOUNTER — Ambulatory Visit (INDEPENDENT_AMBULATORY_CARE_PROVIDER_SITE_OTHER): Payer: Self-pay | Admitting: Obstetrics and Gynecology

## 2012-03-10 VITALS — BP 114/72 | Temp 97.1°F | Wt 159.2 lb

## 2012-03-10 DIAGNOSIS — O09899 Supervision of other high risk pregnancies, unspecified trimester: Secondary | ICD-10-CM

## 2012-03-10 DIAGNOSIS — O09529 Supervision of elderly multigravida, unspecified trimester: Secondary | ICD-10-CM

## 2012-03-10 DIAGNOSIS — Z8751 Personal history of pre-term labor: Secondary | ICD-10-CM

## 2012-03-10 DIAGNOSIS — O09219 Supervision of pregnancy with history of pre-term labor, unspecified trimester: Secondary | ICD-10-CM

## 2012-03-10 DIAGNOSIS — O09299 Supervision of pregnancy with other poor reproductive or obstetric history, unspecified trimester: Secondary | ICD-10-CM

## 2012-03-10 DIAGNOSIS — D259 Leiomyoma of uterus, unspecified: Secondary | ICD-10-CM

## 2012-03-10 LAB — CBC
HCT: 35.9 % — ABNORMAL LOW (ref 36.0–46.0)
Hemoglobin: 12.2 g/dL (ref 12.0–15.0)
MCH: 28.6 pg (ref 26.0–34.0)
MCV: 84.1 fL (ref 78.0–100.0)
RBC: 4.27 MIL/uL (ref 3.87–5.11)
WBC: 6.3 10*3/uL (ref 4.0–10.5)

## 2012-03-10 LAB — POCT URINALYSIS DIP (DEVICE)
Bilirubin Urine: NEGATIVE
Hgb urine dipstick: NEGATIVE
Ketones, ur: NEGATIVE mg/dL
Specific Gravity, Urine: 1.01 (ref 1.005–1.030)
pH: 7 (ref 5.0–8.0)

## 2012-03-10 NOTE — Progress Notes (Signed)
Pulse 81 Patient reports pelvic pressure and an itchy rash on her legs that started one week ago.  Needs cbc, rpr, hiv and 17p

## 2012-03-10 NOTE — Progress Notes (Signed)
Patient doing well. No visible rash on her legs, patient states it resolved a few days ago but was very pruritic s/p 17-p injection. 2nd trimester labs today. Continue weekly 17-p

## 2012-03-11 ENCOUNTER — Encounter: Payer: Self-pay | Admitting: Advanced Practice Midwife

## 2012-03-16 ENCOUNTER — Ambulatory Visit (INDEPENDENT_AMBULATORY_CARE_PROVIDER_SITE_OTHER): Payer: Self-pay

## 2012-03-16 VITALS — BP 106/69 | HR 77 | Wt 157.6 lb

## 2012-03-16 DIAGNOSIS — Z8751 Personal history of pre-term labor: Secondary | ICD-10-CM

## 2012-03-16 DIAGNOSIS — O09219 Supervision of pregnancy with history of pre-term labor, unspecified trimester: Secondary | ICD-10-CM

## 2012-03-24 ENCOUNTER — Ambulatory Visit (INDEPENDENT_AMBULATORY_CARE_PROVIDER_SITE_OTHER): Payer: Self-pay | Admitting: Family Medicine

## 2012-03-24 ENCOUNTER — Encounter: Payer: Self-pay | Admitting: Family Medicine

## 2012-03-24 VITALS — BP 110/73 | Temp 97.3°F | Wt 157.9 lb

## 2012-03-24 DIAGNOSIS — O09899 Supervision of other high risk pregnancies, unspecified trimester: Secondary | ICD-10-CM

## 2012-03-24 DIAGNOSIS — O09299 Supervision of pregnancy with other poor reproductive or obstetric history, unspecified trimester: Secondary | ICD-10-CM

## 2012-03-24 DIAGNOSIS — O09529 Supervision of elderly multigravida, unspecified trimester: Secondary | ICD-10-CM

## 2012-03-24 DIAGNOSIS — O09219 Supervision of pregnancy with history of pre-term labor, unspecified trimester: Secondary | ICD-10-CM

## 2012-03-24 LAB — POCT URINALYSIS DIP (DEVICE)
Bilirubin Urine: NEGATIVE
Ketones, ur: NEGATIVE mg/dL
Protein, ur: NEGATIVE mg/dL
Specific Gravity, Urine: 1.02 (ref 1.005–1.030)
pH: 7 (ref 5.0–8.0)

## 2012-03-24 NOTE — Progress Notes (Signed)
Doing well--No complaints.  17P today.

## 2012-03-24 NOTE — Patient Instructions (Addendum)
Sport and exercise psychologist  (Preterm Labor) El parto prematuro comienza antes de la semana 37 de [place]. La duracin de un embarazo normal es de 39 a 41 semanas.  CAUSAS  Generalmente no hay una causa que pueda identificarse. Sin embargo, una de las causas conocidas ms frecuentes son las infecciones. Las infecciones del tero, el cuello, la vagina, el lquido [place], la vejiga, los riones y Teacher, adult education de los pulmones (neumona) pueden hacer que el trabajo de parto se inicie. Otras causas son:   Infecciones urogenitales, como infecciones por hongos y vaginosis bacteriana.   Anormalidades uterinas (forma del tero, sptum uterino, fibromas, hemorragias en la placenta).   Un cuello que ha sido operado y se abre prematuramente.   Malformaciones del beb.   Gestaciones mltiples (mellizos, trillizos y ms).   Ruptura del saco amnitico.  :Otros factores de riesgo del parto prematuro son   Historia previa de Sport and exercise psychologist.   Ruptura prematura de las [place].   La placenta cubre la apertura del cuello (placenta previa).   La placenta se separa del tero (abrupcin placentaria).   El cuello es demasiado dbil para contener al beb en el tero (cuello incompetente).   Hay mucho lquido en el saco amnitico (polihidramnios).   Consumo de drogas o hbito de fumar durante Firefighter.   No aumentar de peso lo suficiente durante el [place].   Mujeres menores de 18 aos o [address] de [address] [place].   Nivel socioeconmico bajo.   Raza afroamericana.  SNTOMAS  Los signos y sntomas son:   Clicos del tipo menstrual   Contracciones con un intervalo entre 30 y 70 segundos, comienzan a ser regulares, se hacen ms frecuentes y se hacen ms intensas y dolorosas.   Contracciones que comienzan en la parte superior del tero y se expanden hacia abajo, hacia la zona inferior del abdomen y la espalda.   Sensacin de presin en la pelvis o dolor en la espalda.   Aparece una secrecin acuosa o  sanguinolenta por la vagina.  DIAGNSTICO  El diagnstico puede confirmarse:   Con un examen vaginal.   Ecografa del cuello.   Muestra (hisopado) de las secreciones crvico-vaginales. Estas muestras se analizan para buscar la presencia de fibronectina fetal. Esta protena que se encuentra en las secreciones del tero y se asocia con el parto prematuro.   Monitoreo fetal  TRATAMIENTO  Segn el tiempo del Psychiatrist y otras [place], el mdico puede indicar reposo en cama. Si es necesario, le indicarn medicamentos para TEFL teacher las contracciones y apurar la maduracin de los pulmones del feto. Si el trabajo de parto se inicia antes de las 34 semanas de [place], se recomienda la hospitalizacin. El tratamiento depende de las condiciones en que se encuentre la madre y el beb.  PREVENCIN  Hay algunas cosas que American Financial puede hacer para disminuir el riesgo de trabajo de parto prematuro en futuros [employer]. Una mam puede:   Dejar de fumar.   Mantener un peso saludable y evitar sustancias qumicas y drogas innecesarias.   Controlar todo tipo de infeccin.   Informar al mdico si tiene una historia conocida de parto prematuro.  Document Released: 12/08/2007 Document Revised: 08/20/2011 [facility] Patient Information 2012 [place], [place]. Vanetta Mulders - Systems analyst trimestre (Pregnancy - Third Trimester) El tercer trimestre del embarazo (los ltimos 3 meses) es el perodo de cambios ms rpidos que atraviesan usted y el beb. El aumento de peso es ms rpido. El beb alcanza un largo de aproximadamente 50 cm (20 pulgadas) y pesa  entre 2,700 y 4,500 kg (6 a 10 libras). El beb gana ms tejido graso y ya est listo para la vida fuera del cuerpo de la [place]. Mientras estn en el interior, los bebs tienen perodos de sueo y vigilia, Warehouse manager y tienen hipo. Quizs sienta pequeas contracciones del tero. Este es el falso trabajo de [place]. Tambin se las conoce como contracciones de  [place]. Es como una prctica del parto. Los problemas ms habituales de esta etapa del embarazo incluyen mayor dificultad para respirar, hinchazn de las manos y los pies por retencin de lquidos y la necesidad de Geographical information systems officer con ms frecuencia debido a que el tero y el beb presionan sobre la vejiga.  EXAMENES PRENATALES  Durante los Manpower Inc, deber seguir realizando pruebas de [place], segn avance el [place]. Estas pruebas se realizan para controlar su salud y la del beb. Tambin se realizan anlisis de sangre para [employer] niveles de [place]. La anemia (bajo nivel de hemoglobina) es frecuente durante el embarazo. Para prevenirla, se administran hierro y vitaminas. Tambin le harn nuevas pruebas para descartar la diabetes. Podrn repetirle algunas de las Hovnan[employer] Enterprises hicieron previamente.   En cada visita le medirn el tamao del tero. Es para asegurarse de que el beb se desarrolla correctamente.   Tambin en cada visita la pesarn. Esto se realiza para asegurarse de que aumenta de peso al ritmo indicado y que usted y su beb evolucionan normalmente.   En algunas ocasiones se realiza una ecografa para confirmar el correcto desarrollo y evolucin del beb. Esta prueba se realiza con ondas sonoras inofensivas para el beb, de modo que el profesional pueda calcular con ms precisin la fecha del [place].   Discuta las posibilidades de la anestesia si necesita cesrea.  Algunas veces se realizan pruebas especializadas del lquido amnitico que rodea al beb. Esta prueba se denomina amniocentesis. El lquido amnitico se obtiene introduciendo una aguja en el abdomen (vientre). En ocasiones se lleva a cabo cerca del final del embarazo, si es Optician, dispensing. En este caso se realiza para asegurarse de que los pulmones del beb estn lo suficientemente maduros como para que pueda vivir fuera del tero. CAMBIOS QUE OCURREN EN EL TERCER TRIMESTRE DEL EMBARAZO Su  organismo atravesar diferentes cambios durante el embarazo que varan de Neomia Dear persona a Educational psychologist. Converse con el profesional que la asiste acerca los cambios que usted note y que la preocupen.  Durante el ltimo trimestre probablemente sienta un aumento del apetito. Es normal tener "antojos" de Development worker, community. Esto vara de Neomia Dear persona a otra y de un embarazo a Therapist, art.   Podrn aparecer las primeras estras en las caderas, abdomen y [place]. Estos son cambios normales del cuerpo durante el [place]. No existen medicamentos ni ejercicios que puedan prevenir CarMax.   El estreimiento puede tratarse con un laxante o agregando fibra a su dieta. Beber grandes cantidades de lquidos, tomar fibras en forma de verduras, frutas y granos integrales es de [place].   Tambin es beneficioso practicar actividad fsica. Si ha sido una persona Engineer, mining, podr continuar con la Harley[employer]Davidson de las actividades durante el mismo. Si ha sido [employer], puede ser beneficioso que comience con un programa de ejercicios, Museum/gallery exhibitions officer. Consulte con el profesional que la asiste antes de comenzar un programa de ejercicios.   Evite el consumo de cigarrillos, el alcohol, los medicamentos no prescritos y las "drogas de la calle" durante el [place]. Estas sustancias qumicas  afectan la formacin y el desarrollo del beb. Evite estas sustancias durante todo el embarazo para asegurar el nacimiento de un beb sano.   Dolor de espalda, venas varicosas y hemorroides podran aparecer o empeorar.   Los movimientos del beb pueden ser ms bruscos y aparecer ms a menudo.   Puede que note dificultades para respirar facilmente.   El ombligo podra salrsele hacia afuera.   Puede segregar un lquido amarillento (calostro) de las [place].   Puede segregar mucus con sangre. Esto normalmente ocurre unos [address] a una semana antes de que comience el [place] de [place].  INSTRUCCIONES PARA EL CUIDADO  DOMICILIARIO  La mayor parte de los cuidados que se aconsejan son los mismos que los indicados para las primeras etapas del Psychiatrist. Es importante que concurra a todas las citas con el profesional y siga sus instrucciones con Camera operator a los medicamentos que deba Chemical engineer, a la actividad fsica y a Psychologist, forensic.   Durante el embarazo debe obtener nutrientes para usted y para su beb. Consuma alimentos balanceados a intervalos regulares. Elija alimentos como carne, pescado, [place] y otros productos lcteos descremados, verduras, frutas, panes integrales y cereales. El Equities trader cul es el aumento de peso ideal.   Las relaciones sexuales pueden continuarse hasta casi el final del embarazo, si no se presentan otros problemas como prdida prematura (antes de tiempo) de lquido amnitico, hemorragia vaginal o dolor abdominal (en el vientre).   Realice Tesoro Corporation, si no tiene restricciones. Consulte con el profesional que la asiste si no sabe con certeza si determinados ejercicios son seguros. El mayor aumento de peso se produce Foot Locker ltimos trimestres del [place].   Haga reposo con frecuencia, con las piernas elevadas, o segn lo necesite para evitar los calambres y el dolor de cintura.   Use un buen sostn o como los que se usan para hacer deportes para Paramedic la sensibilidad de las [place]. Tambin puede serle til si lo [place] mientras duerme. Si pierde Product manager, podr Parker Hannifin.   No utilice la baera con agua caliente, baos turcos y saunas.   Colquese el cinturn de seguridad cuando conduzca. Este la proteger a usted y al beb en caso de accidente.   Evite comer carne cruda y el contacto con los utensilios y desperdicios de los gatos. Estos elementos contienen grmenes que pueden causar defectos de nacimiento en el beb.   Es fcil perder algo de orina durante el [place]. Apretar y Chief Operating Officer los msculos de la pelvis la ayudar con este  problema. Practique detener la miccin cuando est en el bao. Estos son los mismos msculos que Development worker, international aid. Son TEPPCO Partners mismos msculos que utiliza cuando trata de Ryder System gases. Puede practicar apretando estos msculos WellPoint, y repetir esto tres veces por da aproximadamente. Una vez que conozca qu msculos debe contraer, no realice estos ejercicios durante la miccin. Puede favorecerle una infeccin si la orina vuelve hacia atrs.   Pida ayuda si tiene necesidades econmicas, de asesoramiento o nutricionales durante el [place]. El profesional podr ayudarla con respecto a estas necesidades, o derivarla a otros especialistas.   Practique la ida [employer] General hospital a modo de [place].   Tome clases prenatales junto con su pareja para comprender, practicar y hacer preguntas acerca del Aleen Campi de parto y el nacimiento.   Prepare la habitacin del beb.   No viaje fuera de la ciudad a menos que sea absolutamente necesario y con el  consejo del mdico.   Use slo zapatos bajos sin taco para tener un mejor equilibrio y prevenir cadas.  EL CONSUMO DE MEDICAMENTOS Y DROGAS DURANTE EL EMBARAZO  Contine tomando las vitaminas apropiadas para esta etapa tal como se le indic. Las vitaminas deben contener un miligramo de cido flico y deben suplementarse con hierro. Guarde todas las vitaminas fuera del alcance de los nios. La ingestin de slo un par de vitaminas o comprimidos que contengan hierro pueden ocasionar la Newmont Mining en un beb o en un nio pequeo.   Evite el uso de [place], inclusive los de venta [place], que no hayan sido prescritos o indicados por el profesional que la asiste. Algunos medicamentos pueden causar problemas fsicos al beb. Utilice los medicamentos de venta libre o de prescripcin para Chief Technology Officer, Environmental health practitioner o la [place], segn se lo indique el profesional que lo asiste. No utilice aspirina, ibuprofeno (Motrin, Advil, Nuprin) o naproxeno (Aleve) a menos que  el profesional la autorice.   El alcohol se asocia a cierto nmero de defectos del nacimiento, incluido el sndrome de alcoholismo fetal. Debe evitar el consumo de alcohol en cualquiera de sus formas. El cigarrillo causa nacimientos prematuros y bebs de bajo peso al nacer. Las drogas de la calle son muy nocivas para el beb y estn absolutamente prohibidas. Un beb que nace de American Express, ser adicto al nacer. Ese beb tendr los mismos sntomas de abstinencia que un adulto.   Infrmele al profesional si consume alguna droga.  SOLICITE ATENCIN MDICA SI: Tiene alguna preocupacin Academic librarian. Es mejor que llame para formular las preguntas si no puede esperar hasta la prxima visita, que sentirse preocupada por ellas.  DECISIONES ACERCA DE LA CIRCUNCISIN Usted puede saber o no cul es el sexo de su beb. Si es un varn, ste es el momento de pensar acerca de la circuncisin. La circuncisin es la extirpacin del prepucio. Esta es la piel que cubre el extremo sensible del pene. No hay un motivo mdico que lo justifique. Generalmente la decisin se toma segn lo que sea popular en ese momento, o se basa en creencias religiosas. Podr conversar estos temas con el profesional que la asiste. SOLICITE ATENCIN MDICA DE INMEDIATO SI:  La temperatura oral se eleva sin motivo por encima de 102 F (38.9 C) o segn le indique el profesional que la asiste.   Tiene una prdida de lquido por la vagina (canal de parto). Si sospecha una ruptura de las [place], tmese la temperatura y llame al profesional para informarlo sobre esto.   Observa unas pequeas manchas, una hemorragia vaginal o elimina cogulos. Avsele al profesional acerca de la cantidad y de cuntos apsitos est utilizando.   Presenta un olor desagradable en la secrecin vaginal y observa un cambio en el color, de transparente a blanco.   Ha vomitado durante ms de 24 horas.   Presenta escalofros o fiebre.   Comienza a  sentir falta de aire.   Siente ardor al Beatrix Shipper.   [place] o sube ms de 900 g (ms de 2 libras), o segn lo indicado por el profesional que la asiste. Observa que sbitamente se le hinchan el rostro, las manos, los pies o las piernas.   Presenta dolor abdominal. Las molestias en el ligamento redondo son Neomia Dear causa benigna (no cancerosa) frecuente de Engineer, mining abdominal durante el Psychiatrist, pero el profesional que la asiste deber evaluarlo.   Presenta dolor de cabeza intenso que no se [place].   Si no siente  los movimientos del beb durante ms de tres horas. Si piensa que el beb no se mueve tanto como lo haca habitualmente, coma algo que Psychologist, clinical y Target Corporation lado izquierdo durante [place]. El beb debe moverse al menos 4  5 veces por hora. Comunquese inmediatamente si el beb se mueve menos que lo indicado.   Se cae, se ve involucrada en un accidente automovilstico o sufre algn tipo de traumatismo.   En su hogar hay violencia mental o fsica.  Document Released: 06/10/2005 Document Revised: 08/20/2011 [facility] Patient Information 2012 [place], [place]. Eleccin del mtodo anticonceptivo  (Contraception Choices) La anticoncepcin (control de la natalidad) es el uso de cualquier mtodo o dispositivo para Location manager. A continuacin se indican algunos de esos mtodos.  MTODOS HORMONALES   Implante anticonceptivo. Es un tubo plstico delgado que contiene la hormona progesterona. No contiene estrgenos. El mdico inserta el tubo en la parte interna del brazo. El tubo puede Geneticist, molecular durante 3 aos. Despus de los 3 aos debe retirarse. El implante impide que los ovarios liberen vulos (ovulacin), espesa el moco cervical, lo que evita que los espermatozoides ingresen al tero y hace ms delgada la membrana que cubre el interior del tero.   Inyecciones de progesterona sola. Estas inyecciones se administran cada 3 meses para evitar el embarazo. La progesterona  sinttica impide que los ovarios liberen vulos. Tambin hace que el moco cervical se espese y modifica el recubrimiento interno del tero. Esto hace ms difcil que los espermatozoides sobrevivan en el tero.   Pldoras anticonceptivas. Las pldoras anticonceptivas contienen estrgenos y Education officer, museum. Actan impidiendo que el vulo se forme en el ovario(ovulacin). Las pldoras anticonceptivas son recetadas por el mdico.Tambin se utilizan para tratar los perodos menstruales abundantes.   Minipldora. Este tipo de pldora anticonceptiva contiene slo hormona progesterona. Deben tomarse todos los [address] del mes y debe recetarlas el mdico.   Parches anticonceptivos. El parche contiene hormonas similares a las que contienen las pldoras anticonceptivas. Deben cambiarse una vez por semana y se utilizan bajo prescripcin mdica.   Anillo vaginal. Anillo vaginal contiene hormonas similares a las que contienen las pldoras anticonceptivas. Se deja colocado durante tres semanas, se lo retira durante 1 semana y luego se coloca uno nuevo. La paciente debe sentirse cmoda para insertar y retirar el anillo de la vagina.Es necesaria la receta del mdico.   Anticonceptivos de Associate Professor. Los anticonceptivos de emergencia son mtodos para evitar un embarazo despus de una relacin sexual sin proteccin. Esta pldora puede tomarse inmediatamente despus de Child psychotherapist sexuales o hasta 5 [place] de haber tenido sexo sin proteccin. Es ms efectiva si se toma poco tiempo despus. Los anticonceptivos de emergencia estn disponibles sin prescripcin mdica. Consltelo con su farmacutico. No use los anticonceptivos de emergencia como nico mtodo anticonceptivo.  MTODOS DE BARRERA   Condn masculino. Es una vaina delgada (ltex o goma) que se [place] en el pene durante el acto sexual. Deri Fuelling con espermicida para aumentar la efectividad.   Condn femenino. Es una vaina blanda y floja que se adapta suavemente a la  vagina antes de las relaciones sexuales.   Diafragma. Es una barrera de ltex redonda y Casimer Bilis que debe ser ajustada por un profesional. Se inserta en la vagina, junto con un gel espermicida. Debe insertarse antes de Management consultant. Debe dejar el diafragma colocado en la vagina durante 6 a 8 horas despus de la relacin sexual.   Capuchn cervical. Es una taza de  ltex o plstico, redonda y [place] que cubre el cuello del tero y debe ser ajustada por un mdico. Puede dejarlo colocado en la vagina hasta 48 horas despus de las relaciones sexuales.   Esponja. Es una pieza blanda y circular de espuma de poliuretano. Contiene un espermicida. Se inserta en la vagina despus de mojarla y antes de las The St. Paul Travelers.   Espermicidas. Los espermicidas son qumicos que matan o bloquean el esperma y no lo dejan ingresar al cuello del tero y al tero. Vienen en forma de cremas, geles, supositorios, espuma o comprimidos. No es necesario tener Emergency planning/management officer. Se insertan en la vagina con un aplicador antes de Management consultant. El proceso debe repetirse cada vez que tiene relaciones sexuales.  ANTICONCEPTIVOS INTRAUTERINOS   Dispositivo intrauterino (DIU). Es un dispositivo en forma de T que se coloca en el tero durante el perodo menstrual, para Location manager. Hay dos tipos:   DIU de cobre. Este tipo de DIU est recubierto con un alambre de cobre y se inserta dentro del tero. El cobre hace que el tero y las trompas de Falopio produzcan un liquido que [employer] Stores espermatozoides. Puede permanecer colocado durante 10 aos.   DIU hormonal. Este tipo de DIU contiene la hormona progestina (progesterona sinttica). La hormona espesa el moco cervical y evita que los espermatozoides ingresen al tero y tambin afina la membrana que cubre el tero para evitar la implantacin del vulo fertilizado. La hormona debilita o destruye los espermatozoides que ingresan al tero. Puede permanecer  colocado durante 5 aos.  MTODOS ANTICONCEPTIVOS PERMANENTES   Ligadura de trompas en la mujer. La ligadura de trompas en la mujer se realiza sellando, atando u obstruyendo quirrgicamente las trompas de Falopio lo que impide que el vulo descienda hacia el tero.   Esterilizacin masculina. Se realiza atando los conductos por los que pasan los espermatozoides (vasectoma).Esto impide que el esperma ingrese a la vagina durante el acto sexual. Luego del procedimiento, el hombre puede eyacular lquido (semen).  MTODOS DE PLANIFICACIN NATURAL   Planificacin familiar natural.  Consiste en no tener relaciones sexuales o usar un mtodo de barrera (condn, [place], capuchn cervical) en los [employer] la mujer podra quedar [place].   Mtodo calendario.  Consiste en el seguimiento de la duracin de cada ciclo menstrual y la identificacin de los perodos frtiles.   Mtodo de Occupational hygienist.  Consiste en evitar las relaciones sexuales durante la ovulacin.   Mtodo sintotrmico. Paramedic las relaciones sexuales en la poca en la que se est ovulando, utilizando un termmetro y tendiendo en cuenta los sntomas de la ovulacin.   Mtodo post-ovulacin. Consiste en planificar las relaciones sexuales para despus de haber ovulado.  Independientemente del tipo o mtodo anticonceptivo que usted elija, es importante que use condones para protegerse contra las enfermedades de transmisin sexual (ETS). Hable con su mdico con respecto a qu mtodo anticonceptivo es el ms apropiado para usted.  Document Released: 08/31/2005 Document Revised: 08/20/2011 [facility] Patient Information 2012 [place], [place]. Amamantar al beb (Breastfeeding) LOS BENEFICIOS DE AMAMANTAR Para el beb  La primera leche (calostro ) ayuda al mejor funcionamiento del sistema digestivo del beb.   La leche tiene anticuerpos que provienen de la madre y que ayudan a prevenir las infecciones en el beb.   Hay una menor  incidencia de asma, enfermedades alrgicas y SMSI (sndrome de muerte sbita nfantil).   Los nutrientes que contiene la [place] materna son mejores que las frmulas para el bibern  y favorecen el desarrollo cerebral.   Los bebs amamantados sufren menos gases, clicos y constipacin.  Para la mam  La lactancia materna favorece el desarrollo de un vnculo muy especial entre la madre y el beb.   Es ms conveniente, siempre disponible a la Optician, dispensing y ms econmica que la [employer].   Consume caloras en la madre y la ayuda a perder el peso ganado durante el [place].   Favorece la contraccin del tero a su tamao normal, de manera ms rpida y [employer]away las hemorragias luego del [place].   Las [employer] Holdings que amamantan tienen menor riesgo de Geophysical data processor de mama.  AMAMNTELO CON FRECUENCIA  Un beb sano, nacido a trmino, puede amamantarse con tanta frecuencia como cada hora, o espaciar las comidas cada tres horas.   Esta frecuencia variar de un beb a otro. Observe al beb cuando manifieste signos de hambre, antes que regirse por el reloj.   Amamntelo tan seguido como el beb lo solicite, o cuando usted sienta la necesidad de Paramedic sus [place].   Despierte al beb si han pasado 3  4 horas desde la ltima comida.   El amamantamiento frecuente la ayudar a producir ms [place] y a Education officer, community de Engineer, mining en los pezones e hinchazn de las [place].  LA POSICIN DEL BEB PARA AMAMANTARLO  Ya sea que se encuentre acostada o sentada, asegrese que el abdomen del beb enfrente el suyo.   Sostenga la mama con el pulgar por arriba y el resto de los dedos por debajo. Asegrese que sus dedos se encuentren lejos del pezn y de la boca del beb.   Toque suavemente los labios del beb y la mejilla ms cercana a la mama con el dedo o el pezn.   Cuando la boca del beb se abra lo suficiente, introduzca el pezn y la zona oscura que lo rodea tanto como le sea posible dentro de  la boca.   Coloque a beb cerca suyo de modo que su nariz y mejillas toquen las mamas al [employer] Instruments.  LAS COMIDAS  La duracin de cada comida vara de un beb a otro y de [place] comida a [place].   El beb debe succionar alrededor American Financial o tres minutos para que le llegue [place]. Esto se denomina "bajada". Por este motivo, permita que el nio se alimente en cada mama todo lo que desee. Terminar de mamar cuando haya recibido la cantidad [place] de nutrientes.   Para detener la succin coloque su dedo en la comisura de la boca del nio y Midwife entre sus encas antes de quitarle la mama de la boca. Esto la ayudar a English as a second language teacher.  REDUCIR LA CONGESTIN DE LAS MAMAS  Durante la primera semana despus del parto, usted puede experimentar [employer]. Cuando las mamas estn congestionadas, se sienten calientes, llenas y molestas al tacto. Puede reducir la congestin si:   Lo amamanta frecuentemente, cada 2-3 horas. Las mams que [employer] pronto y con frecuencia tienen menos problemas de [place].   Coloque bolsas fras livianas entre cada [place]. Esto ayuda a Building services engineer. Envuelva las bolsas de hielo en una toalla liviana para proteger su piel.   Aplique compresas hmedas calientes [employer] durante 5 a 10 minutos antes de amamantar al [employer]. Esto aumenta la circulacin y [place] a que la [place].   Masajee suavemente la mama antes y Psychologist, sport and exercise.   Asegrese que el nio vaca al [employer]  mama antes de cambiar de lado.   Use un sacaleche para vaciar la mama si el beb se duerme o no se alimenta bien. Tambin podr [employer] con esta bomba si tiene que volver al trabajo o siente que las mamas estn congestionadas.   Evite los biberones, chupetes o complementar la alimentacin con agua o jugos en lugar de la [place].   Verifique que el beb se encuentra en la posicin correcta mientras lo alimenta.   Evite el cansancio, el estrs  y la anemia   Use un soutien que sostenga bien sus mamas y evite los que tienen aro.   Consuma una dieta balanceada y beba lquidos en cantidad.  Si sigue estas indicaciones, la congestin debe mejorar en 24 a 48 horas. Si an tiene dificultades, consulte a Barista. TENDR SUFICIENTE LECHE MI BEB? Algunas veces las madres se preocupan acerca de si sus bebs tendrn la leche suficiente. Puede asegurarse que el beb tiene la leche suficiente si:  El beb succiona y escucha que traga activamente.   El nio se alimenta al menos 8 a 12 veces en 24 horas. Alimntelo hasta que se desprenda por sus propios medios o se quede dormido en la primera mama (al menos durante 10 a 20 minutos), luego ofrzcale el otro lado.   El beb moja 5 a 6 paales descartables (6 a 8 paales de tela) en 24 horas cuando tiene 5  6 das de vida.   Tiene al menos 2-3 deposiciones todos los [employer] primeros meses. La leche materna es todo el alimento que el beb necesita. No es necesario que el nio ingiera agua o preparados de bibern. De hecho, para ayudar a que sus mamas produzcan ms [place], lo mejor es no darle al beb suplementos durante las primeras semanas.   La materia fecal debe ser blanda y [place].   El beb debe aumentar 112 a 196 g por semana.  CUDESE Cuide sus mamas del siguiente modo:  Bese o dchese diariamente.   No lave sus pezones con jabn.   Comience a amamantar del lado izquierdo en una comida y del lado derecho en la siguiente.   Notar que [employer] suministro de [place] a los 2 a 5 [address]  [address] despus del [place]. Puede sentir algunas molestias por la congestin, lo que hace que sus mamas estn duras y sensibles. La congestin disminuye en 24 a 48 horas. Mientras tanto, aplique toallas hmedas calientes durante 5 a 10 minutos antes de amamantar. Un masaje suave y la extraccin de un poco de leche antes de Museum/gallery exhibitions officer ablandarn las mamas y har ms fcil que el beb se agarre.  Use un buen sostn y seque al aire los pezones durante 10 a 15 minutos luego de cada alimentacin.   Solo utilice apsitos de algodn.   Utilice lanolina [employer] pezones luego de [place]. No necesita lavarlos luego de alimentar al [employer].  Cudese del siguiente modo:   Consuma alimentos bien balanceados y refrigerios nutritivos.   Dixie Dials, jugos de fruta y agua para Warehouse manager sed (alrededor de 8 vasos por Futures trader).   Descanse lo suficiente.   Aumente la ingesta de calcio en la dieta (1200mg /da).   Evite los alimentos que usted nota que puedan afectar al beb.  SOLICITE ATENCIN MDICA SI:  Tiene preguntas que formular o dificultades con la alimentacin a pecho.   Necesita ayuda.   Observa una zona dura, roja y que le duele en la zona  de la mama, y se acompaa de fiebre de 100.5 F (38.1 C) o ms.   El beb est muy somnoliento como para alimentarse bien o tiene problemas para dormir.   El beb moja menos de 6 paales por da, a partir de los [address] de [place].   La piel del beb o la parte blanca de sus ojos est ms amarilla de lo que estaba en el hospital.   Se siente deprimida.  Document Released: 08/31/2005 Document Revised: 08/20/2011 [facility] Patient Information 2012 [place], [place]. Citica (Sciatica) La citica consiste en la debilidad y/o cambios en la sensibilidad (hormigueo, sacudidas, calor y fro, adormecimiento) a lo largo del recorrido del nervio citico. La irritacin o el dao de las races nerviosas lumbares generalmente se denominan radiculopata lumbar.  La radiculopata lumbar (citica) es la forma ms frecuente de este problema, pero una radiculopata puede ocurrir en cualquiera de los nervios que salen de la mdula espinal. Los problemas que causa dependen de qu nervios estn involucrados. El nervio citico es un gran nervio que suministra ramificaciones nerviosas que se dirigen a las extremidades inferiores (desde la cadera a los pies). La citica  generalmente causa adormecimiento o debilidad en la piel y/o en los msculos que inerva el nervio citico. Tambin puede causar sntomas (problemas) como dolor, [place], hormigueo, sensacin de electricidad en el recorrido del nervio. Habitualmente se debe a una lesin de las fibras que forman el nervio citico. Algunos de estos sntomas son dolor de cintura y/o sensaciones desagradables en las siguiente reas:  Desde la zona media de la nalga hacia abajo, en la parte posterior de la pierna y hasta la rodilla.   Desde la salida de la pantorrilla hasta la parte superior del pie.   Detrs de Hydrographic surveyor interna del tobillo hasta la planta del pie.  CAUSAS   Disco herniado o desplazado. Los discos son pequeas almohadillas (porciones de disco) que se encuentran entre las vrtebras.   Presin ejercida por los [employer] piriformes de las nalgas sobre el nervio citico. (Sndrome piriforme).   Mala alineacin de los huesos de la cintura y las nalgas (trastorno de la Dispensing optician).   Estrechamiento del canal espinal que ejerce presin o pellizca las fibras que forman el nervio citico.   Desplazamiento de [place], de modo que sale de la lnea en la que se encuentran las dems vrtebras.   Anormalidad del sistema nervioso de modo que las fibras nerviosas no transmiten las seales correctamente, especialmente a los pies y las pantorrillas (neuropata).   Tumor (no es frecuente).  El profesional puede determinar la causa de la citica y comenzar el tratamiento que tenga ms probabilidad de ser efectivo para usted. TRATAMIENTO Puede tomar medicamentos para Engineer, materials de venta [place], puede usar fisioterapia, reposo, ejercicios, masajes en la columna e inyecciones de anestsicos, corticoides o toxinas botulnicas. Tambin pueden ser [employer], la acupuntura y la [place]. Las tcnicas de Microbiologist u otras tcnicas de sugestin y la modificacin de ciertos factores como la cama,  la silla, la altura del escritorio, la postura y [place] actividades son otros tratamientos que pueden ser de [place]. El profesional que lo asiste, junto con sus aportes, ayudarn a Chief Strategy Officer qu es lo mejor para usted. Con un correcto diagnstico (determinar cul es el problema), la causa de la mayor parte de los casos de citica podrn identificarse y solucionarse. La comunicacin y la cooperacin entre usted y el profesional que lo asiste es fundamental. Si no obtiene xito inmediatamente,  no debe desalentarse, ya que con Museum/gallery conservator podr encontrar el tratamiento adecuado que lo har mejorar. INSTRUCCIONES PARA EL CUIDADO DOMICILIARIO  Si el dolor proviene de un problema en la espalda, aplicar hielo en la zona durante 15 a 20 minutos, 3 a 4 veces por da, mientras se encuentre despierto, podr serle beneficioso. Ponga el hielo en una bolsa plstica y coloque una toalla entre la bolsa y la piel.   Puede practicar ejercicios o realizar las actividades habituales si no Visual merchandiser, o hgalo segn las indicaciones del profesional que lo asiste.   Utilice los medicamentos de venta libre o de prescripcin para Chief Technology Officer, Environmental health practitioner o la [place], segn se lo indique el profesional que lo asiste.   Si el profesional que lo [employer] pide que concurra a una cita de seguimiento, es importante asistir a ella. No concurrir a la [place] representative como consecuencia una lesin crnica o [place], dolor, e incapacidad. Si tiene algn problema para asistir a la cita, debe comunicarse con el establecimiento para obtener asistencia.  SOLICITE ATENCIN MDICA DE INMEDIATO SI:  Pierde el control de la vejiga o del intestino.   Aumenta la debilidad en el tronco, las nalgas o las piernas.   Siente que se adormece cualquier zona desde la cadera [employer] dedos de los pies.   Tiene dificultad para caminar o mantener el equilibrio.   Y si presenta los sntomas mencionados, junto con fiebre o vmitos  intensos.  Document Released: 08/31/2005 Document Revised: 08/20/2011 [facility] Patient Information 2012 [place], [place].

## 2012-03-24 NOTE — Progress Notes (Signed)
P= 81 Mild pain in upper thighs and lower pelvic area

## 2012-03-31 ENCOUNTER — Ambulatory Visit (INDEPENDENT_AMBULATORY_CARE_PROVIDER_SITE_OTHER): Payer: Self-pay | Admitting: *Deleted

## 2012-03-31 VITALS — BP 100/61 | HR 76 | Temp 97.5°F | Ht 63.0 in | Wt 159.8 lb

## 2012-03-31 DIAGNOSIS — O09219 Supervision of pregnancy with history of pre-term labor, unspecified trimester: Secondary | ICD-10-CM

## 2012-03-31 DIAGNOSIS — Z8751 Personal history of pre-term labor: Secondary | ICD-10-CM

## 2012-04-07 ENCOUNTER — Ambulatory Visit (INDEPENDENT_AMBULATORY_CARE_PROVIDER_SITE_OTHER): Payer: Self-pay | Admitting: Obstetrics & Gynecology

## 2012-04-07 VITALS — BP 102/64 | Temp 97.9°F | Wt 160.2 lb

## 2012-04-07 DIAGNOSIS — O09529 Supervision of elderly multigravida, unspecified trimester: Secondary | ICD-10-CM

## 2012-04-07 DIAGNOSIS — O09219 Supervision of pregnancy with history of pre-term labor, unspecified trimester: Secondary | ICD-10-CM

## 2012-04-07 LAB — POCT URINALYSIS DIP (DEVICE)
Glucose, UA: NEGATIVE mg/dL
Ketones, ur: NEGATIVE mg/dL
Protein, ur: NEGATIVE mg/dL
Specific Gravity, Urine: 1.02 (ref 1.005–1.030)
Urobilinogen, UA: 0.2 mg/dL (ref 0.0–1.0)

## 2012-04-07 NOTE — Progress Notes (Signed)
Continue 17-P weekly until 36 weeks.   N complaints.  One contraction a day.  No current lesions.  Pt aware to come to office with pain, burning, or itching so we can do test.  Pt encouraged to get Mirena IUD.

## 2012-04-07 NOTE — Progress Notes (Signed)
Pulse: 87 Would like to be tested for hsv her husband tested positive.

## 2012-04-07 NOTE — Progress Notes (Signed)
Condoms for birth control.  No vaginal lesions.  Pt told to come in with lesions and we will test.

## 2012-04-07 NOTE — Patient Instructions (Signed)
Levonorgestrel intrauterine device (IUD) Qu es este medicamento? El LEVONORGESTREL (DIU) es un dispositivo anticonceptivo (control de natalidad). Se utiliza para evitar el embarazo durante un perodo de hasta 5 aos. Este medicamento puede ser utilizado para otros usos; si tiene alguna pregunta consulte con su proveedor de atencin mdica o con su farmacutico. Qu le debo informar a mi profesional de la salud antes de tomar este medicamento? Necesita saber si usted presenta alguno de los siguientes problemas o situaciones: -exmen de Papanicolaou anormal -cncer de mama, cuello del tero o tero -diabetes -endometritis -si tiene una infeccin plvica o genital actual o en el pasado -tiene ms de una pareja sexual o si su pareja tiene ms de una pareja -enfermedad cardiaca -antecedente de embarazo tubrico o ectpico -problemas del sistema inmunolgico -DIU colocado -enfermedad heptica o tumor del hgado -problemas con la coagulacin o si toma diluyentes sanguneos -usa medicamentos intravenoso -forma inusual del tero -sangrado vaginal que no tiene explicacin -una reaccin alrgica o inusual al levonorgestrel, a otras hormonas, a la silicona o polietilenos, a otros medicamentos, alimentos, colorantes o conservantes -si est embarazada o buscando quedar embarazada -si est amamantando a un beb Cmo debo utilizar este medicamento? Un profesional de la salud coloca este dispositivo en el tero. Hable con su pediatra para informarse acerca del uso de este medicamento en nios. Puede requerir atencin especial. Sobredosis: Pngase en contacto inmediatamente con un centro toxicolgico o una sala de urgencia si usted cree que haya tomado demasiado medicamento. ATENCIN: Este medicamento es solo para usted. No comparta este medicamento con nadie. Qu sucede si me olvido de una dosis? No se aplica en este caso. Qu puede interactuar con este medicamento? No tome esta medicina con  ninguno de los siguientes medicamentos: -amprenavir -bosentano -fosamprenavir Esta medicina tambin puede interactuar con los siguientes medicamentos: -aprepitant -barbitricos para producir el sueo o para el tratamiento de convulsiones -bexaroteno -griseofulvina -medicamentos para tratar los convulsiones, tales como carbamazepina, etotona, felbamato, oxcarbazepina, fenitona, topiramato -modafinilo -pioglitazona -rifabutina -rifampicina -rifapentina -algunos medicamentos para tratar el virus VIH, tales como atazanavir, indinavir, lopinavir, nelfinavir, tipranavir, ritonavir -hierba de [place] -warfarina Puede ser que esta lista no menciona todas las posibles interacciones. Informe a su profesional de la salud de todos los productos a base de hierbas, medicamentos de venta libre o suplementos nutritivos que est tomando. Si usted fuma, consume bebidas alcohlicas o si utiliza drogas ilegales, indqueselo tambin a su profesional de la salud. Algunas sustancias pueden interactuar con su medicamento. A qu debo estar atento al usar este medicamento? Visite a su mdico o a su profesional de la salud para chequear su evolucin peridicamente. Visite a su mdico si usted o su pareja tiene relaciones sexuales con otras personas, se vuelve VIH positivo o contrae una enfermedad de transmisin sexual. Este medicamento no la protege de la infeccin por VIH (SIDA) ni de ninguna otra enfermedad de transmisin sexual. Puede controlar la ubicacin del DIU usted misma palpando con sus dedos limpios los hilos en la parte anterior de la vagina. No tire de los hilos. Es un buen hbito controlar la ubicacin del dispositivo despus de cada perodo menstrual. Si no slo siente los hilos sino que adems siente otra parte ms del DIU o si no puede sentir los hilos, consulte a su mdico inmediatamente. El DIU puede salirse por s solo. Puede quedar embarazada si el dispositivo se sale de su lugar. Utilice un  mtodo anticonceptivo adicional, como preservativos, y consulte a su proveedor de atencin mdica s observa que   el DIU se sali de su lugar. La utilizacin de tampones no cambia la posicin del DIU y no hay inconvenientes en usarlos durante su perodo. Qu efectos secundarios puedo tener al utilizar este medicamento? Efectos secundarios que debe informar a su mdico o a su profesional de la salud tan pronto como sea posible: -reacciones alrgicas como erupcin cutnea, picazn o urticarias, hinchazn de la cara, labios o lengua -fiebre, sntomas gripales -llagas genitales -alta presin sangunea -ausencia de un perodo menstrual durante 6 semanas mientras lo utiliza -dolor, hinchazn o calor en las piernas -dolor o sensibilidad del plvico -dolor de cabeza repentino o severo -signos de embarazo -calambres estomacales -falta de aliento repentina -problemas de coordinacin, del habla, al caminar -sangrado, flujo vaginal inusual -color amarillento de los ojos o la piel Efectos secundarios que, por lo general, no requieren atencin mdica (debe informarlos a su mdico o a su profesional de la salud si persisten o si son molestos): -acn -dolor de pecho -cambios en el deseo sexual o capacidad -cambios de peso -calambres, mareos o sensacin de desmayo mientras se introduce el dispositivo -dolor de cabeza -sangrado menstruales irregulares en los primeros 3 a 6 meses de usar -nuseas Puede ser que esta lista no menciona todos los posibles efectos secundarios. Comunquese a su mdico por asesoramiento mdico sobre los efectos secundarios. Usted puede informar los efectos secundarios a la FDA por telfono al 1-800-FDA-1088. Dnde debo guardar mi medicina? No se aplica en este caso. ATENCIN: Este folleto es un resumen. Puede ser que no cubra toda la posible informacin. Si usted tiene preguntas acerca de esta medicina, consulte con su mdico, su farmacutico o su profesional de la salud.   2012, Elsevier/Gold Standard. (11/12/2008 3:17:12 PM) 

## 2012-04-13 ENCOUNTER — Ambulatory Visit (INDEPENDENT_AMBULATORY_CARE_PROVIDER_SITE_OTHER): Payer: Self-pay

## 2012-04-13 VITALS — BP 106/69 | HR 86 | Wt 161.5 lb

## 2012-04-13 DIAGNOSIS — O09219 Supervision of pregnancy with history of pre-term labor, unspecified trimester: Secondary | ICD-10-CM

## 2012-04-13 DIAGNOSIS — Z8751 Personal history of pre-term labor: Secondary | ICD-10-CM

## 2012-04-21 ENCOUNTER — Encounter: Payer: Self-pay | Admitting: Physician Assistant

## 2012-04-21 ENCOUNTER — Ambulatory Visit (INDEPENDENT_AMBULATORY_CARE_PROVIDER_SITE_OTHER): Payer: Self-pay | Admitting: Physician Assistant

## 2012-04-21 VITALS — BP 113/69 | Temp 98.7°F | Wt 162.5 lb

## 2012-04-21 DIAGNOSIS — O09529 Supervision of elderly multigravida, unspecified trimester: Secondary | ICD-10-CM

## 2012-04-21 DIAGNOSIS — O09219 Supervision of pregnancy with history of pre-term labor, unspecified trimester: Secondary | ICD-10-CM

## 2012-04-21 LAB — POCT URINALYSIS DIP (DEVICE)
Ketones, ur: NEGATIVE mg/dL
Protein, ur: NEGATIVE mg/dL
Urobilinogen, UA: 0.2 mg/dL (ref 0.0–1.0)

## 2012-04-21 NOTE — Patient Instructions (Signed)
Parto prematuro  (Preterm Labor) El parto prematuro comienza antes de la semana 37 de embarazo. La duracin de un embarazo normal es de 39 a 41 semanas.  CAUSAS  Generalmente no hay una causa que pueda identificarse. Sin embargo, una de las causas conocidas ms frecuentes son las infecciones. Las infecciones del tero, el cuello, la vagina, el lquido amnitico, la vejiga, los riones y hasta de los pulmones (neumona) pueden hacer que el trabajo de parto se inicie. Otras causas son:   Infecciones urogenitales, como infecciones por hongos y vaginosis bacteriana.   Anormalidades uterinas (forma del tero, sptum uterino, fibromas, hemorragias en la placenta).   Un cuello que ha sido operado y se abre prematuramente.   Malformaciones del beb.   Gestaciones mltiples (mellizos, trillizos y ms).   Ruptura del saco amnitico.  :Otros factores de riesgo del parto prematuro son   Historia previa de parto prematuro.   Ruptura prematura de las membranas.   La placenta cubre la apertura del cuello (placenta previa).   La placenta se separa del tero (abrupcin placentaria).   El cuello es demasiado dbil para contener al beb en el tero (cuello incompetente).   Hay mucho lquido en el saco amnitico (polihidramnios).   Consumo de drogas o hbito de fumar durante el embarazo.   No aumentar de peso lo suficiente durante el embarazo.   Mujeres menores de 18 aos o mayores de 35 aos aos.   Nivel socioeconmico bajo.   Raza afroamericana.  SNTOMAS  Los signos y sntomas son:   Clicos del tipo menstrual   Contracciones con un intervalo entre 30 y 70 segundos, comienzan a ser regulares, se hacen ms frecuentes y se hacen ms intensas y dolorosas.   Contracciones que comienzan en la parte superior del tero y se expanden hacia abajo, hacia la zona inferior del abdomen y la espalda.   Sensacin de presin en la pelvis o dolor en la espalda.   Aparece una secrecin acuosa o  sanguinolenta por la vagina.  DIAGNSTICO  El diagnstico puede confirmarse:   Con un examen vaginal.   Ecografa del cuello.   Muestra (hisopado) de las secreciones crvico-vaginales. Estas muestras se analizan para buscar la presencia de fibronectina fetal. Esta protena que se encuentra en las secreciones del tero y se asocia con el parto prematuro.   Monitoreo fetal  TRATAMIENTO  Segn el tiempo del embarazo y otras circunstancias, el mdico puede indicar reposo en cama. Si es necesario, le indicarn medicamentos para detener las contracciones y apurar la maduracin de los pulmones del feto. Si el trabajo de parto se inicia antes de las 34 semanas de embarazo, se recomienda la hospitalizacin. El tratamiento depende de las condiciones en que se encuentre la madre y el beb.  PREVENCIN  Hay algunas cosas que una madre puede hacer para disminuir el riesgo de trabajo de parto prematuro en futuros embarazos. Una mam puede:   Dejar de fumar.   Mantener un peso saludable y evitar sustancias qumicas y drogas innecesarias.   Controlar todo tipo de infeccin.   Informar al mdico si tiene una historia conocida de parto prematuro.  Document Released: 12/08/2007 Document Revised: 08/20/2011 [employer] Patient Information 2012 [employer]. 

## 2012-04-21 NOTE — Progress Notes (Signed)
Pulse- 80  Pain-lower back  Pressure- front, lower abd, vaginal

## 2012-04-21 NOTE — Progress Notes (Signed)
Reports 3 contractions per day. Low back pain. PTL precautions reviewed. By Leopold's Vtx/OP. Comfort measures reviewed. Continue 17-p.

## 2012-04-26 ENCOUNTER — Inpatient Hospital Stay (HOSPITAL_COMMUNITY)
Admission: AD | Admit: 2012-04-26 | Discharge: 2012-04-26 | Disposition: A | Discharge status: Home / Self Care | Payer: Self-pay | Source: Ambulatory Visit | Attending: Obstetrics & Gynecology | Admitting: Obstetrics & Gynecology

## 2012-04-26 ENCOUNTER — Encounter (HOSPITAL_COMMUNITY): Payer: Self-pay | Admitting: *Deleted

## 2012-04-26 DIAGNOSIS — O99891 Other specified diseases and conditions complicating pregnancy: Secondary | ICD-10-CM | POA: Insufficient documentation

## 2012-04-26 DIAGNOSIS — R51 Headache: Secondary | ICD-10-CM | POA: Insufficient documentation

## 2012-04-26 DIAGNOSIS — O26899 Other specified pregnancy related conditions, unspecified trimester: Secondary | ICD-10-CM

## 2012-04-26 LAB — URINALYSIS, ROUTINE W REFLEX MICROSCOPIC
Ketones, ur: NEGATIVE mg/dL
Leukocytes, UA: NEGATIVE
Nitrite: NEGATIVE
Protein, ur: NEGATIVE mg/dL
pH: 6.5 (ref 5.0–8.0)

## 2012-04-26 LAB — COMPREHENSIVE METABOLIC PANEL
Albumin: 3.1 g/dL — ABNORMAL LOW (ref 3.5–5.2)
BUN: 8 mg/dL (ref 6–23)
Creatinine, Ser: 0.43 mg/dL — ABNORMAL LOW (ref 0.50–1.10)
Total Protein: 6.6 g/dL (ref 6.0–8.3)

## 2012-04-26 LAB — CBC
HCT: 34.1 % — ABNORMAL LOW (ref 36.0–46.0)
MCH: 28.8 pg (ref 26.0–34.0)
MCHC: 34 g/dL (ref 30.0–36.0)
MCV: 84.6 fL (ref 78.0–100.0)
RDW: 13.2 % (ref 11.5–15.5)

## 2012-04-26 MED ORDER — ACETAMINOPHEN 325 MG PO TABS
650.0000 mg | ORAL_TABLET | Freq: Once | ORAL | Status: AC
  Administered 2012-04-26: 650 mg via ORAL
  Filled 2012-04-26: qty 2

## 2012-04-26 NOTE — MAU Provider Note (Signed)
History     CSN: [id number]  Arrival date and time: 04/26/12 1607   First Provider Initiated Contact with Patient 04/26/12 1744      Chief Complaint  Patient presents with  . Abdominal Pain   HPI Pt is a 41 yo [id number] at 35.0 who presents with headache and seeing spots since Thursday. The headache is 7/10 now. It goes away with tylenol but continue to come back, often keeping her from sleeping or starting in the early morning. It is frontal and bilateral. She also has nausea. Her last office visit was Thurs. She has not had high BP or elevated blood sugar during this pregnancy. She is also feeling pelvic pressure but no discreet contractions and she is having some pain with urination and low back pain.  No vaginal bleeding, no loss of fluid, + fetal movement.  OB History    Grav Para Term Preterm Abortions TAB SAB Ect Mult Living   9 5 3 2 3  0 3 0 0 5     Last two deliveries pre-term. Next to last was c-section. She had PPROM and had an emergent c-section for fetal distress. Her last delivery she ruptured and was given pitocin and went on to VBAC successfully.  Past Medical History  Diagnosis Date  . Gestational diabetes   . Asthma   . Urinary tract infection   . Fibroid   . PONV (postoperative nausea and vomiting)   . Depression     pt is depressed about her current pregnancy  . Asthma 01/25/2012    Past Surgical History  Procedure Date  . Dilation and curettage of uterus   . Cesarean section     Family History  Problem Relation Age of Onset  . Anesthesia problems Neg Hx     History  Substance Use Topics  . Smoking status: Never Smoker   . Smokeless tobacco: Never Used  . Alcohol Use: No    Allergies: No Known Allergies  Prescriptions prior to admission  Medication Sig Dispense Refill  . albuterol (PROVENTIL HFA;VENTOLIN HFA) 108 (90 BASE) MCG/ACT inhaler Inhale 2 puffs into the lungs every 4 (four) hours as needed. Patient used this medication to assist  with breathing.  1 Inhaler  1  . Fexofenadine HCl (ALLEGRA PO) Take 1 tablet by mouth daily as needed. Patient used this medication for her allergies.      . fluticasone (FLONASE) 50 MCG/ACT nasal spray Place 2 sprays into the nose daily.  1 g  2  . Prenatal Vit-Fe Fumarate-FA (MULTIVITAMIN-PRENATAL) 27-0.8 MG TABS Take 1 tablet by mouth daily.        Review of Systems  Constitutional: Negative for fever and chills.  HENT: Negative for congestion.   Eyes: Negative for blurred vision and double vision.       Seeing spots/floaters for past 4-5 days  Respiratory: Negative for cough.   Gastrointestinal: Positive for nausea. Negative for vomiting.  Genitourinary: Positive for dysuria.  Musculoskeletal: Positive for back pain (low back bilaterl).  Neurological: Positive for dizziness (on Sunday), weakness (on Sunday - generalized) and headaches.   Physical Exam   Blood pressure 115/74, pulse 71, temperature 98 F (36.7 C), temperature source Oral, resp. rate 20, height 5' (1.524 m), weight 74.503 kg (164 lb 4 oz), last menstrual period 08/25/2011.  Physical Exam  Constitutional: She is oriented to person, place, and time. She appears well-developed and well-nourished. She appears distressed.  HENT:  Head: Normocephalic and atraumatic.  Eyes: Conjunctivae and  EOM are normal.  Neck: Normal range of motion. Neck supple.  Cardiovascular: Normal rate, regular rhythm, normal heart sounds and intact distal pulses.   Respiratory: Effort normal and breath sounds normal.  GI: Soft. Bowel sounds are normal. There is no tenderness. There is no rebound and no guarding.  Musculoskeletal: She exhibits no edema and no tenderness.  Neurological: She is alert and oriented to person, place, and time. She has normal reflexes.  Skin: Skin is warm and dry.  Psychiatric: She has a normal mood and affect.   FHTs:  135, mod var, + accels. One variable at 17:40 down to 65 lasting one minute with good recovery.  Last 10 minutes, no accels but no further decels.  Dilation: Fingertip Effacement (%): Thick Cervical Position: Middle Done by:  RN    MAU Course  Procedures   Tylenol given in MAU.  Headache resolved. No ctx, lof, vb. + fetal movement. Results for orders placed during the hospital encounter of 04/26/12 (from the past 24 hour(s))  URINALYSIS, ROUTINE W REFLEX MICROSCOPIC     Status: Abnormal   Collection Time   04/26/12  6:00 PM      Component Value Range   Color, Urine YELLOW  YELLOW   APPearance CLEAR  CLEAR   Specific Gravity, Urine 1.010  1.005 - 1.030   pH 6.5  5.0 - 8.0   Glucose, UA NEGATIVE  NEGATIVE mg/dL   Hgb urine dipstick TRACE (*) NEGATIVE   Bilirubin Urine NEGATIVE  NEGATIVE   Ketones, ur NEGATIVE  NEGATIVE mg/dL   Protein, ur NEGATIVE  NEGATIVE mg/dL   Urobilinogen, UA 0.2  0.0 - 1.0 mg/dL   Nitrite NEGATIVE  NEGATIVE   Leukocytes, UA NEGATIVE  NEGATIVE  URINE MICROSCOPIC-ADD ON     Status: Normal   Collection Time   04/26/12  6:00 PM      Component Value Range   Squamous Epithelial / LPF RARE  RARE   WBC, UA 0-2  <3 WBC/hpf   RBC / HPF 0-2  <3 RBC/hpf  CBC     Status: Abnormal   Collection Time   04/26/12  7:05 PM      Component Value Range   WBC 7.4  4.0 - 10.5 K/uL   RBC 4.03  3.87 - 5.11 MIL/uL   Hemoglobin 11.6 (*) 12.0 - 15.0 g/dL   HCT 69.6 (*) 29.5 - 28.4 %   MCV 84.6  78.0 - 100.0 fL   MCH 28.8  26.0 - 34.0 pg   MCHC 34.0  30.0 - 36.0 g/dL   RDW 13.2  44.0 - 10.2 %   Platelets 176  150 - 400 K/uL  COMPREHENSIVE METABOLIC PANEL     Status: Abnormal   Collection Time   04/26/12  7:05 PM      Component Value Range   Sodium 133 (*) 135 - 145 mEq/L   Potassium 4.4  3.5 - 5.1 mEq/L   Chloride 101  96 - 112 mEq/L   CO2 22  19 - 32 mEq/L   Glucose, Bld 82  70 - 99 mg/dL   BUN 8  6 - 23 mg/dL   Creatinine, Ser 7.25 (*) 0.50 - 1.10 mg/dL   Calcium 9.1  8.4 - 36.6 mg/dL   Total Protein 6.6  6.0 - 8.3 g/dL   Albumin 3.1 (*) 3.5 - 5.2 g/dL    AST 13  0 - 37 U/L   ALT 8  0 - 35 U/L  Alkaline Phosphatase 100  39 - 117 U/L   Total Bilirubin 0.1 (*) 0.3 - 1.2 mg/dL   GFR calc non Af Amer >90  >90 mL/min   GFR calc Af Amer >90  >90 mL/min   UA negative, no protein. BP normal:   Filed Vitals:   04/26/12 1615 04/26/12 1657 04/26/12 1820  BP: 115/74 121/74 114/66  Pulse: 71 68   Temp: 98 F (36.7 C)    TempSrc: Oral    Resp: 20    Height: 5' (1.524 m)    Weight: 74.503 kg (164 lb 4 oz)     CBC and CMP normal, blood sugar normal.  MDM   Assessment and Plan  40 y.o. [id number] at [redacted]w[redacted]d with headache and floaters.  No signs of preeclampsia or preterm labor. Discharge home. May continue tylenol as needed for headache. Precautions explained for preeclampsia symptoms and possible preterm labor. Pt has f/u for 17P shot in 2 days and with HRC in 10 days.   Napoleon Form 04/26/2012, 5:58 PM

## 2012-04-28 ENCOUNTER — Ambulatory Visit (INDEPENDENT_AMBULATORY_CARE_PROVIDER_SITE_OTHER): Payer: Self-pay

## 2012-04-28 VITALS — BP 111/67 | HR 79 | Temp 97.6°F | Wt 164.1 lb

## 2012-04-28 DIAGNOSIS — Z8751 Personal history of pre-term labor: Secondary | ICD-10-CM

## 2012-04-28 DIAGNOSIS — O09219 Supervision of pregnancy with history of pre-term labor, unspecified trimester: Secondary | ICD-10-CM

## 2012-05-05 ENCOUNTER — Ambulatory Visit (INDEPENDENT_AMBULATORY_CARE_PROVIDER_SITE_OTHER): Payer: Self-pay | Admitting: Advanced Practice Midwife

## 2012-05-05 ENCOUNTER — Encounter: Payer: Self-pay | Admitting: Advanced Practice Midwife

## 2012-05-05 VITALS — BP 115/73 | Temp 97.0°F | Wt 163.5 lb

## 2012-05-05 DIAGNOSIS — O09299 Supervision of pregnancy with other poor reproductive or obstetric history, unspecified trimester: Secondary | ICD-10-CM | POA: Insufficient documentation

## 2012-05-05 DIAGNOSIS — O09529 Supervision of elderly multigravida, unspecified trimester: Secondary | ICD-10-CM

## 2012-05-05 DIAGNOSIS — O34219 Maternal care for unspecified type scar from previous cesarean delivery: Secondary | ICD-10-CM

## 2012-05-05 DIAGNOSIS — O09899 Supervision of other high risk pregnancies, unspecified trimester: Secondary | ICD-10-CM

## 2012-05-05 DIAGNOSIS — Z98891 History of uterine scar from previous surgery: Secondary | ICD-10-CM

## 2012-05-05 DIAGNOSIS — Z8751 Personal history of pre-term labor: Secondary | ICD-10-CM

## 2012-05-05 DIAGNOSIS — O09219 Supervision of pregnancy with history of pre-term labor, unspecified trimester: Secondary | ICD-10-CM

## 2012-05-05 LAB — POCT URINALYSIS DIP (DEVICE)
Bilirubin Urine: NEGATIVE
Glucose, UA: NEGATIVE mg/dL
Leukocytes, UA: NEGATIVE
Nitrite: NEGATIVE
Urobilinogen, UA: 0.2 mg/dL (ref 0.0–1.0)
pH: 7 (ref 5.0–8.0)

## 2012-05-05 NOTE — Progress Notes (Signed)
Pulse- 73  Edema-feet  Pain/pressure- lower abd  Vaginal discharge- "brown discharge with no odor"

## 2012-05-05 NOTE — Progress Notes (Signed)
Plans VBAC. Consent under media tab. Growth [place] scheduled for Hx SGA (3 pounds at 36 weeks)

## 2012-05-05 NOTE — Patient Instructions (Signed)

## 2012-05-06 ENCOUNTER — Ambulatory Visit (HOSPITAL_COMMUNITY)
Admission: RE | Admit: 2012-05-06 | Discharge: 2012-05-06 | Disposition: A | Discharge status: Home / Self Care | Payer: Self-pay | Source: Ambulatory Visit | Attending: Advanced Practice Midwife | Admitting: Advanced Practice Midwife

## 2012-05-06 DIAGNOSIS — O09299 Supervision of pregnancy with other poor reproductive or obstetric history, unspecified trimester: Secondary | ICD-10-CM | POA: Insufficient documentation

## 2012-05-06 DIAGNOSIS — O09529 Supervision of elderly multigravida, unspecified trimester: Secondary | ICD-10-CM | POA: Insufficient documentation

## 2012-05-06 DIAGNOSIS — Z8751 Personal history of pre-term labor: Secondary | ICD-10-CM | POA: Insufficient documentation

## 2012-05-06 DIAGNOSIS — O3410 Maternal care for benign tumor of corpus uteri, unspecified trimester: Secondary | ICD-10-CM | POA: Insufficient documentation

## 2012-05-06 DIAGNOSIS — O09899 Supervision of other high risk pregnancies, unspecified trimester: Secondary | ICD-10-CM

## 2012-05-06 LAB — GC/CHLAMYDIA PROBE AMP, GENITAL: Chlamydia, DNA Probe: NEGATIVE

## 2012-05-08 LAB — CULTURE, BETA STREP (GROUP B ONLY)

## 2012-05-12 ENCOUNTER — Ambulatory Visit (INDEPENDENT_AMBULATORY_CARE_PROVIDER_SITE_OTHER): Payer: Self-pay | Admitting: Family Medicine

## 2012-05-12 VITALS — BP 112/75 | Temp 97.8°F | Wt 163.8 lb

## 2012-05-12 DIAGNOSIS — O4100X0 Oligohydramnios, unspecified trimester, not applicable or unspecified: Secondary | ICD-10-CM | POA: Insufficient documentation

## 2012-05-12 DIAGNOSIS — O09299 Supervision of pregnancy with other poor reproductive or obstetric history, unspecified trimester: Secondary | ICD-10-CM

## 2012-05-12 DIAGNOSIS — O09529 Supervision of elderly multigravida, unspecified trimester: Secondary | ICD-10-CM

## 2012-05-12 LAB — POCT URINALYSIS DIP (DEVICE)
Bilirubin Urine: NEGATIVE
Ketones, ur: NEGATIVE mg/dL
Leukocytes, UA: NEGATIVE
Nitrite: NEGATIVE
Protein, ur: NEGATIVE mg/dL

## 2012-05-12 NOTE — Patient Instructions (Signed)
Trabajo de parto y parto normal (Normal Labor and Delivery) En primer lugar, su mdico debe estar seguro de que usted est en trabajo de parto. Algunos signos son:  Puede haber eliminado el "tapn mucoso" antes que comience el trabajo de parto. Se trata de una pequea cantidad de mucus con sangre.   Tiene contracciones uterinas regulares.   El tiempo entre las contracciones se acorta.   Las molestias y el dolor se hacen gradualmente ms intensos.   El dolor se ubica principalmente en la espalda.   Los dolores empeoran al caminar.   El cuello del tero (la apertura del tero se hace ms delgada, comienza a borrarse, y se abre (se dilata).  Una vez que se encuentre en trabajo de parto y sea admitida en el hospital, el mdico har lo siguiente:  Un examen fsico completo.   Controlar sus signos vitales (presin arterial, pulso, temperatura y la frecuencia cardaca fetal).   Realizar un examen vaginal (usando un guante estril y lubricante para determinar:   La posicin (presentacin) del beb (ceflica [vertex] o nalgas primero).   El nivel (plano) de la cabeza del beb en el canal de parto.   El borramiento y dilatacin del cuello del tero.   Le rasurarn el vello pbico y le aplicarn una enema segn lo considere el mdico y las circunstancias.   Generalmente se coloca un monitor electrnico sobre el abdomen. El monitor sigue la duracin e intensidad de las contracciones, as como la frecuencia cardaca del beb.   Generalmente, el profesional inserta una va intravenosa en el brazo para administrarle agua azucarada. Esta es una medida de precaucin, de modo que puedan administrarle rpidamente medicamentos durante el trabajo de parto.  EL TRABAJO DE PARTO Y PARTO NORMALES SE DIVIDEN EN 3 ETAPAS: Primera etapa Comienzan las contracciones regulares y el cuello comienza a borrarse y dilatarse. Esta etapa puede durar entre 3 y 15 horas. El final de la primera etapa se considera  cuando el cuello est borrado en un 100% y se ha dilatado 10 cm. Le administrarn analgsicos por:  Inyeccin (morfina, demerol, etc.).   Anestesia regional (espinal, caudal o epidural, anestsicos colocados en diferentes regiones de la columna vertebral). Podrn administrarle medicamentos para el dolor en la regin paracervical, que consiste en la aplicacin de un anestsico inyectable en cada uno de los lados del cuello del tero.  La embarazada puede requerir un "parto natural" , es decir no recibir medicamentos o anestesia durante el trabajo de parto y el parto. Segunda etapa En este momento el beb baja a travs del canal de parto (vagina) y nace. Esto puede durar entre 1 y 4 horas. A medida que el beb asoma la cabeza por el canal de parto, podr sentir una sensacin similar a cuando mueve el intestino. Sentir el impulse de empujar con fuerza hasta que el nio salga. A medida que la cabecita baja, el mdico decidir si realiza una episiotoma (corte en el perineo y rea de la vagina) para evitar la ruptura de los tejidos). Luego del nacimiento del beb y la expulsin de la placenta, la episiotoma se sutura. En algunos casos se coloca a la madre una mscara con xido nitroso para facilitar la respiracin y aliviar el dolor. El final de la etapa 2 se produce cuando el beb ha salido completamente. Luego, cuando el cordn umbilical deja de pulsar, se pinza y se corta. Tercera etapa La tercera etapa comienza luego que el beb ha nacido y finaliza luego de la   expulsin de la placenta. Generalmente esto lleva entre 5 y 30 minutos. Luego de la expulsin de la placenta, le aplicarn un medicamento por va intravenosa para ayudar a contraer el tero y prevenir hemorragias. En la tercera etapa no hay dolor y generalmente no son necesarios los analgsicos. Si le han realizado una episiotoma, es el momento de repararla. Luego del parto, la mam es observada y controlada exhaustivamente durante 1  2 horas  para verificar que no hay sangrado en el post parto (hemorragias). Si pierde mucha sangre, le administrarn un medicamento para contraer el tero y detener la hemorragia. Document Released: 08/13/2008 Document Revised: 08/20/2011 [employer] Patient Information 2012 [employer]. 

## 2012-05-12 NOTE — Progress Notes (Signed)
Pulse: 77 Has brown discharge

## 2012-05-12 NOTE — Progress Notes (Signed)
GBS neg U/S-6 lb 12 oz, 76%, AFI 7.2, vtx--will put into 2x/wk testing.

## 2012-05-17 ENCOUNTER — Ambulatory Visit (INDEPENDENT_AMBULATORY_CARE_PROVIDER_SITE_OTHER): Payer: Self-pay | Admitting: *Deleted

## 2012-05-17 VITALS — BP 126/70 | Wt 164.5 lb

## 2012-05-17 DIAGNOSIS — O4100X0 Oligohydramnios, unspecified trimester, not applicable or unspecified: Secondary | ICD-10-CM

## 2012-05-17 NOTE — Progress Notes (Signed)
P-[id number] 

## 2012-05-17 NOTE — Progress Notes (Signed)
NST reviewed and reactive.  Laela Deviney L. Harraway-Smith, M.D., FACOG    

## 2012-05-19 ENCOUNTER — Ambulatory Visit (INDEPENDENT_AMBULATORY_CARE_PROVIDER_SITE_OTHER): Payer: Self-pay | Admitting: Family Medicine

## 2012-05-19 VITALS — BP 127/73 | Temp 98.4°F | Wt 163.6 lb

## 2012-05-19 DIAGNOSIS — O4100X0 Oligohydramnios, unspecified trimester, not applicable or unspecified: Secondary | ICD-10-CM

## 2012-05-19 DIAGNOSIS — Z8751 Personal history of pre-term labor: Secondary | ICD-10-CM

## 2012-05-19 DIAGNOSIS — Z202 Contact with and (suspected) exposure to infections with a predominantly sexual mode of transmission: Secondary | ICD-10-CM

## 2012-05-19 DIAGNOSIS — O09299 Supervision of pregnancy with other poor reproductive or obstetric history, unspecified trimester: Secondary | ICD-10-CM

## 2012-05-19 DIAGNOSIS — O09219 Supervision of pregnancy with history of pre-term labor, unspecified trimester: Secondary | ICD-10-CM

## 2012-05-19 DIAGNOSIS — Z8632 Personal history of gestational diabetes: Secondary | ICD-10-CM

## 2012-05-19 DIAGNOSIS — Z2089 Contact with and (suspected) exposure to other communicable diseases: Secondary | ICD-10-CM

## 2012-05-19 LAB — POCT URINALYSIS DIP (DEVICE)
Glucose, UA: NEGATIVE mg/dL
Nitrite: NEGATIVE
Specific Gravity, Urine: 1.015 (ref 1.005–1.030)
Urobilinogen, UA: 0.2 mg/dL (ref 0.0–1.0)

## 2012-05-19 NOTE — Progress Notes (Signed)
Pulse- 75 Pt c/o contractions x 2

## 2012-05-19 NOTE — Progress Notes (Signed)
Patient reports a gush of watery fluid this morning around 1am with contractions apart following.

## 2012-05-19 NOTE — Progress Notes (Signed)
NST reviewed and reactive. C/o contractions and LOF--Neg pool, neg nitrazine, neg fern Early labor--precautions reviewed.

## 2012-05-19 NOTE — Patient Instructions (Addendum)
Trabajo de parto y parto normal (Normal Labor and Delivery) En primer lugar, su mdico debe estar seguro de que usted est en trabajo de parto. Algunos signos son:  Puede haber eliminado el "tapn mucoso" antes que comience el trabajo de parto. Se trata de una pequea cantidad de mucus con sangre.   Tiene contracciones uterinas regulares.   El tiempo entre las contracciones se acorta.   Las molestias y el dolor se hacen gradualmente ms intensos.   El dolor se ubica principalmente en la espalda.   Los dolores empeoran al caminar.   El cuello del tero (la apertura del tero se hace ms delgada, comienza a borrarse, y se abre (se dilata).  Una vez que se encuentre en trabajo de parto y sea admitida en el hospital, el mdico har lo siguiente:  Un examen fsico completo.   Controlar sus signos vitales (presin arterial, pulso, temperatura y la frecuencia cardaca fetal).   Realizar un examen vaginal (usando un guante estril y lubricante para determinar:   La posicin (presentacin) del beb (ceflica [vertex] o nalgas primero).   El nivel (plano) de la cabeza del beb en el canal de parto.   El borramiento y dilatacin del cuello del tero.   Le rasurarn el vello pbico y le aplicarn una enema segn lo considere el mdico y las circunstancias.   Generalmente se coloca un monitor electrnico sobre el abdomen. El monitor sigue la duracin e intensidad de las contracciones, as como la frecuencia cardaca del beb.   Generalmente, el profesional inserta una va intravenosa en el brazo para administrarle agua azucarada. Esta es una medida de precaucin, de modo que puedan administrarle rpidamente medicamentos durante el trabajo de parto.  EL TRABAJO DE PARTO Y PARTO NORMALES SE DIVIDEN EN 3 ETAPAS: Primera etapa Comienzan las contracciones regulares y el cuello comienza a borrarse y dilatarse. Esta etapa puede durar entre 3 y 15 horas. El final de la primera etapa se considera  cuando el cuello est borrado en un 100% y se ha dilatado 10 cm. Le administrarn analgsicos por:  Inyeccin (morfina, demerol, etc.).   Anestesia regional (espinal, caudal o epidural, anestsicos colocados en diferentes regiones de la columna vertebral). Podrn administrarle medicamentos para el dolor en la regin paracervical, que consiste en la aplicacin de un anestsico inyectable en cada uno de los lados del cuello del tero.  La embarazada puede requerir un "parto natural" , es decir no recibir medicamentos o anestesia durante el trabajo de parto y el parto. Segunda etapa En este momento el beb baja a travs del canal de parto (vagina) y nace. Esto puede durar entre 1 y 4 horas. A medida que el beb asoma la cabeza por el canal de parto, podr sentir una sensacin similar a cuando mueve el intestino. Sentir el impulse de empujar con fuerza hasta que el nio salga. A medida que la cabecita baja, el mdico decidir si realiza una episiotoma (corte en el perineo y rea de la vagina) para evitar la ruptura de los tejidos). Luego del nacimiento del beb y la expulsin de la placenta, la episiotoma se sutura. En algunos casos se coloca a la madre una mscara con xido nitroso para facilitar la respiracin y aliviar el dolor. El final de la etapa 2 se produce cuando el beb ha salido completamente. Luego, cuando el cordn umbilical deja de pulsar, se pinza y se corta. Tercera etapa La tercera etapa comienza luego que el beb ha nacido y finaliza luego de la   expulsin de la placenta. Generalmente esto lleva entre 5 y 30 minutos. Luego de la expulsin de la placenta, le aplicarn un medicamento por va intravenosa para ayudar a Engineer, materials y Psychiatric nurse. En la tercera etapa no hay dolor y generalmente no son necesarios los analgsicos. Si le han realizado una episiotoma, es el momento de Sales promotion account executive. Luego del parto, la mam es observada y controlada exhaustivamente durante 1  2 horas  para verificar que no hay sangrado en el post parto (hemorragias). Si pierde [employer], le administrarn un medicamento para Engineer, manufacturing tero y Comptroller. Document Released: 08/13/2008 Document Revised: 08/20/2011 [facility] Patient Information 2012 [place], [place]. Amamantar al beb (Breastfeeding) LOS BENEFICIOS DE AMAMANTAR Para el beb  La primera leche (calostro ) ayuda al mejor funcionamiento del sistema digestivo del beb.   La leche tiene anticuerpos que provienen de la madre y que ayudan a prevenir las infecciones en el beb.   Hay una menor incidencia de asma, enfermedades alrgicas y SMSI (sndrome de muerte sbita nfantil).   Los nutrientes que contiene la [place] materna son mejores que las frmulas para el bibern y favorecen el desarrollo cerebral.   Los bebs amamantados sufren menos gases, clicos y constipacin.  Para la mam  La lactancia materna favorece el desarrollo de un vnculo muy especial entre la madre y el beb.   Es ms conveniente, siempre disponible a la Optician, dispensing y ms econmica que la [employer]   Consume caloras en la madre y la ayuda a perder el peso ganado durante el [place].   Favorece la contraccin del tero a su tamao normal, de manera ms rpida y [employer] las hemorragias luego del [place].   Las [employer] que amamantan tienen menor riesgo de Geophysical data processor de mama.  AMAMNTELO CON FRECUENCIA  Un beb sano, nacido a trmino, puede amamantarse con tanta frecuencia como cada hora, o espaciar las comidas cada tres horas.   Esta frecuencia variar de un beb a otro. Observe al beb cuando manifieste signos de hambre, antes que regirse por el reloj.   Amamntelo tan seguido como el beb lo solicite, o cuando usted sienta la necesidad de Paramedic sus [place].   Despierte al beb si han pasado 3  4 horas desde la ltima comida.   El amamantamiento frecuente la ayudar a producir ms [place] y a Education officer, community  de Engineer, mining en los pezones e hinchazn de las [place].  LA POSICIN DEL BEB PARA AMAMANTARLO  Ya sea que se encuentre acostada o sentada, asegrese que el abdomen del beb enfrente el suyo.   Sostenga la mama con el pulgar por arriba y el resto de los dedos por debajo. Asegrese que sus dedos se encuentren lejos del pezn y de la boca del beb.   Toque suavemente los labios del beb y la mejilla ms cercana a la mama con el dedo o el pezn.   Cuando la boca del beb se abra lo suficiente, introduzca el pezn y la zona oscura que lo rodea tanto como le sea posible dentro de la boca.   Coloque a beb cerca suyo de modo que su nariz y mejillas toquen las mamas al [employer].  LAS COMIDAS  La duracin de cada comida vara de un beb a otro y de [place] comida a [place].   El beb debe succionar alrededor [employer] o tres minutos para que le llegue [place]. Esto se denomina "bajada". Por este motivo, permita que el nio se alimente en cada  mama todo lo que desee. Terminar de mamar cuando haya recibido la cantidad [place] de nutrientes.   Para detener la succin coloque su dedo en la comisura de la boca del nio y Midwife entre sus encas antes de quitarle la mama de la boca. Esto la ayudar a English as a second language teacher.  REDUCIR LA CONGESTIN DE LAS MAMAS  Durante la primera semana despus del parto, usted puede experimentar [employer]. Cuando las mamas estn congestionadas, se sienten calientes, llenas y molestas al tacto. Puede reducir la congestin si:   Lo amamanta frecuentemente, cada 2-3 horas. Las mams que [employer] pronto y con frecuencia tienen menos problemas de [place].   Coloque bolsas fras livianas entre cada [place]. Esto ayuda a Building services engineer. Envuelva las bolsas de hielo en una toalla liviana para proteger su piel.   Aplique compresas hmedas calientes [employer] durante 5 a 10 minutos antes de amamantar al [employer]. Esto aumenta la circulacin y [place] a que la [place].   Masajee suavemente la mama antes y Psychologist, sport and exercise.   Asegrese que el nio vaca al menos una mama antes de cambiar de lado.   Use un sacaleche para vaciar la mama si el beb se duerme o no se alimenta bien. Tambin podr [employer] con esta bomba si tiene que volver al trabajo o siente que las mamas estn congestionadas.   Evite los biberones, chupetes o complementar la alimentacin con agua o jugos en lugar de la [place].   Verifique que el beb se encuentra en la posicin correcta mientras lo alimenta.   Evite el cansancio, el estrs y la anemia   Use un soutien que sostenga bien sus mamas y evite los que tienen aro.   Consuma una dieta balanceada y beba lquidos en cantidad.  Si sigue estas indicaciones, la congestin debe mejorar en 24 a 48 horas. Si an tiene dificultades, consulte a Barista. TENDR SUFICIENTE LECHE MI BEB? Algunas veces las madres se preocupan acerca de si sus bebs tendrn la leche suficiente. Puede asegurarse que el beb tiene la leche suficiente si:  El beb succiona y escucha que traga activamente.   El nio se alimenta al menos 8 a 12 veces en 24 horas. Alimntelo hasta que se desprenda por sus propios medios o se quede dormido en la primera mama (al menos durante 10 a 20 minutos), luego ofrzcale el otro lado.   El beb moja 5 a 6 paales descartables (6 a 8 paales de tela) en 24 horas cuando tiene 5  6 das de vida.   Tiene al menos 2-3 deposiciones todos los [employer] primeros meses. La leche materna es todo el alimento que el beb necesita. No es necesario que el nio ingiera agua o preparados de bibern. De hecho, para ayudar a que sus mamas produzcan ms [place], lo mejor es no darle al beb suplementos durante las primeras semanas.   La materia fecal debe ser blanda y [place].   El beb debe aumentar 112 a 196 g por semana.  CUDESE Cuide sus mamas del siguiente modo:  Bese o dchese  diariamente.   No lave sus pezones con jabn.   Comience a amamantar del lado izquierdo en una comida y del lado derecho en la siguiente.   Notar que [employer] suministro de [place] a los 2 a 5 [address]  [address] despus del [place]. Puede sentir algunas molestias por la congestin, lo que hace que  sus mamas estn duras y sensibles. La congestin disminuye en 24 a 48 horas. Mientras tanto, aplique toallas hmedas calientes durante 5 a 10 minutos antes de amamantar. Un masaje suave y la extraccin de un poco de leche antes de Museum/gallery exhibitions officer ablandarn las mamas y har ms fcil que el beb se agarre. Use un buen sostn y seque al aire los pezones durante 10 a 15 minutos luego de cada alimentacin.   Solo utilice apsitos de algodn.   Utilice lanolina [employer] pezones luego de [place]. No necesita lavarlos luego de alimentar al [employer].  Cudese del siguiente modo:   Consuma alimentos bien balanceados y refrigerios nutritivos.   Dixie Dials, jugos de fruta y agua para Warehouse manager sed (alrededor de 8 vasos por Futures trader).   Descanse lo suficiente.   Aumente la ingesta de calcio en la dieta (1200mg /da).   Evite los alimentos que usted nota que puedan afectar al beb.  SOLICITE ATENCIN MDICA SI:  Tiene preguntas que formular o dificultades con la alimentacin a pecho.   Necesita ayuda.   Observa una zona dura, roja y que le duele en la zona de la mama, y se acompaa de fiebre de 100.5 F (38.1 C) o ms.   El beb est muy somnoliento como para alimentarse bien o tiene problemas para dormir.   El beb moja menos de 6 paales por da, a partir de los [address] de [place].   La piel del beb o la parte blanca de sus ojos est ms amarilla de lo que estaba en el hospital.   Se siente deprimida.  Document Released: 08/31/2005 Document Revised: 08/20/2011 [facility] Patient Information 2012 [place], [place].

## 2012-05-20 ENCOUNTER — Encounter (HOSPITAL_COMMUNITY): Payer: Self-pay | Admitting: *Deleted

## 2012-05-20 ENCOUNTER — Inpatient Hospital Stay (HOSPITAL_COMMUNITY)
Admission: AD | Admit: 2012-05-20 | Discharge: 2012-05-21 | DRG: 775 | Disposition: A | Discharge status: Home / Self Care | Payer: Medicaid Other | Source: Ambulatory Visit | Attending: Obstetrics & Gynecology | Admitting: Obstetrics & Gynecology

## 2012-05-20 ENCOUNTER — Inpatient Hospital Stay (HOSPITAL_COMMUNITY): Admission: AD | Admit: 2012-05-20 | Payer: Self-pay | Source: Ambulatory Visit | Admitting: Obstetrics & Gynecology

## 2012-05-20 ENCOUNTER — Inpatient Hospital Stay (HOSPITAL_COMMUNITY): Payer: Medicaid Other | Admitting: Anesthesiology

## 2012-05-20 ENCOUNTER — Encounter (HOSPITAL_COMMUNITY): Payer: Self-pay | Admitting: Anesthesiology

## 2012-05-20 DIAGNOSIS — O34219 Maternal care for unspecified type scar from previous cesarean delivery: Principal | ICD-10-CM | POA: Diagnosis present

## 2012-05-20 DIAGNOSIS — O09529 Supervision of elderly multigravida, unspecified trimester: Secondary | ICD-10-CM

## 2012-05-20 DIAGNOSIS — IMO0001 Reserved for inherently not codable concepts without codable children: Secondary | ICD-10-CM

## 2012-05-20 LAB — CBC
HCT: 37.3 % (ref 36.0–46.0)
Hemoglobin: 12.8 g/dL (ref 12.0–15.0)
MCV: 85.2 fL (ref 78.0–100.0)
RBC: 4.38 MIL/uL (ref 3.87–5.11)
WBC: 9.2 10*3/uL (ref 4.0–10.5)

## 2012-05-20 MED ORDER — OXYTOCIN 40 UNITS IN LACTATED RINGERS INFUSION - SIMPLE MED
1.0000 m[IU]/min | INTRAVENOUS | Status: DC
  Administered 2012-05-20: 1 m[IU]/min via INTRAVENOUS

## 2012-05-20 MED ORDER — SIMETHICONE 80 MG PO CHEW: 80.0000 mg | CHEWABLE_TABLET | ORAL | Status: DC | PRN

## 2012-05-20 MED ORDER — OXYCODONE-ACETAMINOPHEN 5-325 MG PO TABS: 1.0000 | ORAL_TABLET | ORAL | Status: DC | PRN

## 2012-05-20 MED ORDER — LIDOCAINE HCL (PF) 1 % IJ SOLN: 30.0000 mL | INTRAMUSCULAR | Status: DC | PRN

## 2012-05-20 MED ORDER — ACETAMINOPHEN 325 MG PO TABS: 650.0000 mg | ORAL_TABLET | ORAL | Status: DC | PRN

## 2012-05-20 MED ORDER — ONDANSETRON HCL 4 MG/2ML IJ SOLN: 4.0000 mg | INTRAMUSCULAR | Status: DC | PRN

## 2012-05-20 MED ORDER — DIPHENHYDRAMINE HCL 25 MG PO CAPS: 25.0000 mg | ORAL_CAPSULE | Freq: Four times a day (QID) | ORAL | Status: DC | PRN

## 2012-05-20 MED ORDER — IBUPROFEN 600 MG PO TABS: 600.0000 mg | ORAL_TABLET | Freq: Four times a day (QID) | ORAL | Status: DC | PRN

## 2012-05-20 MED ORDER — PHENYLEPHRINE 40 MCG/ML (10ML) SYRINGE FOR IV PUSH (FOR BLOOD PRESSURE SUPPORT)
80.0000 ug | PREFILLED_SYRINGE | INTRAVENOUS | Status: DC | PRN
  Filled 2012-05-20: qty 5

## 2012-05-20 MED ORDER — ONDANSETRON HCL 4 MG/2ML IJ SOLN: 4.0000 mg | Freq: Four times a day (QID) | INTRAMUSCULAR | Status: DC | PRN

## 2012-05-20 MED ORDER — TETANUS-DIPHTH-ACELL PERTUSSIS 5-2.5-18.5 LF-MCG/0.5 IM SUSP: 0.5000 mL | Freq: Once | INTRAMUSCULAR | Status: DC

## 2012-05-20 MED ORDER — LANOLIN HYDROUS EX OINT: TOPICAL_OINTMENT | CUTANEOUS | Status: DC | PRN

## 2012-05-20 MED ORDER — WITCH HAZEL-GLYCERIN EX PADS: 1.0000 "application " | MEDICATED_PAD | CUTANEOUS | Status: DC | PRN

## 2012-05-20 MED ORDER — EPHEDRINE 5 MG/ML INJ: 10.0000 mg | INTRAVENOUS | Status: DC | PRN

## 2012-05-20 MED ORDER — ALBUTEROL SULFATE HFA 108 (90 BASE) MCG/ACT IN AERS: 2.0000 | INHALATION_SPRAY | RESPIRATORY_TRACT | Status: DC | PRN

## 2012-05-20 MED ORDER — FLEET ENEMA 7-19 GM/118ML RE ENEM: 1.0000 | ENEMA | RECTAL | Status: DC | PRN

## 2012-05-20 MED ORDER — PHENYLEPHRINE 40 MCG/ML (10ML) SYRINGE FOR IV PUSH (FOR BLOOD PRESSURE SUPPORT)
80.0000 ug | PREFILLED_SYRINGE | INTRAVENOUS | Status: DC | PRN
  Administered 2012-05-20: 80 ug via INTRAVENOUS

## 2012-05-20 MED ORDER — BENZOCAINE-MENTHOL 20-0.5 % EX AERO: 1.0000 "application " | INHALATION_SPRAY | CUTANEOUS | Status: DC | PRN

## 2012-05-20 MED ORDER — SODIUM BICARBONATE 8.4 % IV SOLN
INTRAVENOUS | Status: DC | PRN
  Administered 2012-05-20: 4 mL via EPIDURAL

## 2012-05-20 MED ORDER — LACTATED RINGERS IV SOLN
INTRAVENOUS | Status: DC
  Administered 2012-05-20 (×2): via INTRAVENOUS

## 2012-05-20 MED ORDER — PRENATAL MULTIVITAMIN CH
1.0000 | ORAL_TABLET | Freq: Every day | ORAL | Status: DC
  Administered 2012-05-21: 1 via ORAL
  Filled 2012-05-20: qty 1

## 2012-05-20 MED ORDER — ONDANSETRON HCL 4 MG PO TABS: 4.0000 mg | ORAL_TABLET | ORAL | Status: DC | PRN

## 2012-05-20 MED ORDER — CITRIC ACID-SODIUM CITRATE 334-500 MG/5ML PO SOLN: 30.0000 mL | ORAL | Status: DC | PRN

## 2012-05-20 MED ORDER — SENNOSIDES-DOCUSATE SODIUM 8.6-50 MG PO TABS
2.0000 | ORAL_TABLET | Freq: Every day | ORAL | Status: DC
  Administered 2012-05-20: 2 via ORAL

## 2012-05-20 MED ORDER — FENTANYL 2.5 MCG/ML BUPIVACAINE 1/10 % EPIDURAL INFUSION (WH - ANES)
INTRAMUSCULAR | Status: DC | PRN
  Administered 2012-05-20: 12 mL/h via EPIDURAL

## 2012-05-20 MED ORDER — IBUPROFEN 600 MG PO TABS
600.0000 mg | ORAL_TABLET | Freq: Four times a day (QID) | ORAL | Status: DC
  Administered 2012-05-20 – 2012-05-21 (×2): 600 mg via ORAL
  Filled 2012-05-20 (×4): qty 1

## 2012-05-20 MED ORDER — ZOLPIDEM TARTRATE 5 MG PO TABS: 5.0000 mg | ORAL_TABLET | Freq: Every evening | ORAL | Status: DC | PRN

## 2012-05-20 MED ORDER — DIPHENHYDRAMINE HCL 50 MG/ML IJ SOLN: 12.5000 mg | INTRAMUSCULAR | Status: DC | PRN

## 2012-05-20 MED ORDER — FENTANYL CITRATE 0.05 MG/ML IJ SOLN: 50.0000 ug | INTRAMUSCULAR | Status: DC | PRN

## 2012-05-20 MED ORDER — FENTANYL 2.5 MCG/ML BUPIVACAINE 1/10 % EPIDURAL INFUSION (WH - ANES)
14.0000 mL/h | INTRAMUSCULAR | Status: DC
  Administered 2012-05-20: 14 mL/h via EPIDURAL
  Filled 2012-05-20 (×2): qty 60

## 2012-05-20 MED ORDER — DIBUCAINE 1 % RE OINT: 1.0000 "application " | TOPICAL_OINTMENT | RECTAL | Status: DC | PRN

## 2012-05-20 MED ORDER — LACTATED RINGERS IV SOLN: 500.0000 mL | INTRAVENOUS | Status: DC | PRN

## 2012-05-20 MED ORDER — OXYTOCIN 40 UNITS IN LACTATED RINGERS INFUSION - SIMPLE MED
62.5000 mL/h | Freq: Once | INTRAVENOUS | Status: DC
  Filled 2012-05-20: qty 1000

## 2012-05-20 MED ORDER — OXYTOCIN BOLUS FROM INFUSION
500.0000 mL | Freq: Once | INTRAVENOUS | Status: AC
  Administered 2012-05-20: 250 mL via INTRAVENOUS
  Filled 2012-05-20: qty 500

## 2012-05-20 MED ORDER — TERBUTALINE SULFATE 1 MG/ML IJ SOLN: 0.2500 mg | Freq: Once | INTRAMUSCULAR | Status: DC | PRN

## 2012-05-20 MED ORDER — LACTATED RINGERS IV SOLN
500.0000 mL | Freq: Once | INTRAVENOUS | Status: AC
  Administered 2012-05-20: 500 mL via INTRAVENOUS

## 2012-05-20 NOTE — MAU Note (Signed)
Contractions for the last 24 hours

## 2012-05-20 NOTE — Anesthesia Preprocedure Evaluation (Addendum)
Anesthesia Evaluation  Patient identified by MRN, date of birth, ID band Patient awake    Reviewed: Allergy & Precautions, H&P , Patient's Chart, lab work & pertinent test results  History of Anesthesia Complications (+) PONV  Airway Mallampati: II TM Distance: >3 FB Neck ROM: full    Dental  (+) Teeth Intact   Pulmonary asthma (recently breathing well) ,  breath sounds clear to auscultation        Cardiovascular Rhythm:regular Rate:Normal     Neuro/Psych    GI/Hepatic   Endo/Other  diabetes, Gestational  Renal/GU      Musculoskeletal   Abdominal   Peds  Hematology   Anesthesia Other Findings   AMA Hx of C/S: Now TOLAC   Reproductive/Obstetrics (+) Pregnancy                          Anesthesia Physical Anesthesia Plan  ASA: III  Anesthesia Plan: Epidural   Post-op Pain Management:    Induction:   Airway Management Planned:   Additional Equipment:   Intra-op Plan:   Post-operative Plan:   Informed Consent: I have reviewed the patients History and Physical, chart, labs and discussed the procedure including the risks, benefits and alternatives for the proposed anesthesia with the patient or authorized representative who has indicated his/her understanding and acceptance.   Dental Advisory Given  Plan Discussed with:   Anesthesia Plan Comments: (Labs checked- platelets confirmed with RN in room. Fetal heart tracing, per RN, reported to be stable enough for sitting procedure. Discussed epidural, and patient consents to the procedure:  included risk of possible headache,backache, failed block, allergic reaction, and nerve injury. This patient was asked if she had any questions or concerns before the procedure started. )        Anesthesia Quick Evaluation

## 2012-05-20 NOTE — Anesthesia Procedure Notes (Signed)
Epidural Patient location during procedure: OB  Preanesthetic Checklist Completed: patient identified, site marked, surgical consent, pre-op evaluation, timeout performed, IV checked, risks and benefits discussed and monitors and equipment checked  Epidural Patient position: sitting Prep: site prepped and draped and DuraPrep Patient monitoring: continuous pulse ox and blood pressure Approach: midline Injection technique: LOR air  Needle:  Needle type: Tuohy  Needle gauge: 17 G Needle length: 9 cm and 9 Needle insertion depth: 6 cm Catheter type: closed end flexible Catheter size: 19 Gauge Catheter at skin depth: 12 cm Test dose: negative  Assessment Events: blood not aspirated, injection not painful, no injection resistance, negative IV test and no paresthesia  Additional Notes Dosing of Epidural:  1st dose, through needle ............................................Marland Kitchen epi 1:200K + Xylocaine 40 mg  2nd dose, through catheter, after waiting 3 minutes...Marland KitchenMarland Kitchenepi 1:200K + Xylocaine 40 mg  3rd dose, through catheter after waiting 3 minutes .............................Marcaine   4mg    ( mg Marcaine are expressed as equivilent  cc's medication removed from the 0.1%Bupiv / fentanyl syringe from L&D pump)  ( 2% Xylo charted as a single dose in Epic Meds for ease of charting; actual dosing was fractionated as above, for saftey's sake)  As each dose occurred, patient was free of IV sx; and patient exhibited no evidence of SA injection.  Patient is more comfortable after epidural dosed. Please see RN's note for documentation of vital signs,and FHR which are stable.  Patient reminded not to try to ambulate with numb legs, and that an RN must be present the 1st time she attempts to get up.

## 2012-05-20 NOTE — Anesthesia Postprocedure Evaluation (Signed)
    Anesthesia Post Note  Patient: Sarah Massey  Procedure(s) Performed: * No procedures listed *  Anesthesia type: Epidural  Patient location: Mother/Baby  Post pain: Pain level controlled  Post assessment: Post-op Vital signs reviewed  Last Vitals:  Filed Vitals:   05/20/12 1430  BP: 129/73  Pulse: 55  Temp: 36.6 C  Resp: 20    Post vital signs: Reviewed  Level of consciousness:alert  Complications: No apparent anesthesia complications

## 2012-05-20 NOTE — Progress Notes (Signed)
Patient ID: Sarah Massey, female   DOB: 11-Sep-1971, 41 y.o.   MRN: [id number]   S.  Pt blocked and comfortable.    Tawana Scale Vitals:   05/20/12 0610  BP: 130/81  Pulse: 76  Temp:   Resp:    Dilation: 5 Effacement (%): 90 Station: -2 Presentation: Vertex Exam by:: dr Jamilah Jean  AROM, pink/blood tinged, small amt fluid.  FHTs. Baseline 125, mod var, accels present, no decels. Toco:  q 2-4.  A/P:   41 y.o. [id number] at [redacted]w[redacted]d Spontaneous onset of labor, VBAC (one prior c-section followed by successful vbac) Epidural in place AROM Anticipate SVD.  Napoleon Form, MD 05/20/2012 6:29 AM

## 2012-05-20 NOTE — H&P (Signed)
Sarah Massey is a 41 y.o. female presenting with constant contractions that started yesterday.  Maternal Medical History:  Reason for admission: Reason for admission: contractions and nausea.  Contractions: Onset was yesterday.   Frequency: regular.   Duration is approximately 1 minute.   Perceived severity is strong.    Fetal activity: Perceived fetal activity is normal.   Last perceived fetal movement was within the past hour.    Prenatal complications: no prenatal complications Prenatal Complications - Diabetes: none.   Of Note -- patient has had three spontaneous vaginal deliveries, followed by a cesarean section.  Last born child was delivered by VBAC at 34 wks.  OB History    Grav Para Term Preterm Abortions TAB SAB Ect Mult Living   9 5 3 2 3  0 3 0 0 5     Past Medical History  Diagnosis Date  . Gestational diabetes   . Asthma   . Urinary tract infection   . Fibroid   . PONV (postoperative nausea and vomiting)   . Depression     pt is depressed about her current pregnancy  . Asthma 01/25/2012   Past Surgical History  Procedure Date  . Dilation and curettage of uterus   . Cesarean section    Family History: family history is negative for Anesthesia problems. Social History:  reports that she has never smoked. She has never used smokeless tobacco. She reports that she does not drink alcohol or use illicit drugs.   Prenatal Transfer Tool  Maternal Diabetes: No Genetic Screening: Declined Maternal Ultrasounds/Referrals: Normal Fetal Ultrasounds or other Referrals:  None Maternal Substance Abuse:  No Significant Maternal Medications:  None Significant Maternal Lab Results:  Lab values include: Group B Strep negative Other Comments:  None  Review of Systems  Constitutional: Negative.   HENT: Negative.  Negative for neck pain.   Respiratory: Negative.   Cardiovascular: Positive for leg swelling. Negative for chest pain, palpitations, orthopnea,  claudication and PND.  Gastrointestinal: Positive for nausea. Negative for heartburn, vomiting, abdominal pain, diarrhea, constipation, blood in stool and melena.  Genitourinary: Positive for frequency. Negative for dysuria, urgency, hematuria and flank pain.  Musculoskeletal: Positive for back pain. Negative for myalgias, joint pain and falls.  Skin: Negative.     Dilation: 5 Effacement (%): 90 Station: -2 Exam by:: Thad Ranger MD Blood pressure 133/79, pulse 68, temperature 98.8 F (37.1 C), temperature source Oral, resp. rate 20, last menstrual period 08/25/2011. Maternal Exam:  Uterine Assessment: Contraction strength is moderate.  Contraction duration is 1 minute. Contraction frequency is regular.   Abdomen: Fetal presentation: vertex  Pelvis: adequate for delivery.   Cervix: Cervix evaluated by digital exam.     Fetal Exam Fetal Monitor Review: Mode: ultrasound.   Baseline rate: 130.  Variability: moderate (6-25 bpm).   Pattern: accelerations present and no decelerations.    Fetal State Assessment: Category I - tracings are normal.     Physical Exam  Constitutional: She is oriented to person, place, and time. She appears well-developed and well-nourished. No distress.  HENT:  Head: Normocephalic and atraumatic.  Eyes: Conjunctivae and EOM are normal.  Neck: Normal range of motion.  Cardiovascular: Normal rate, regular rhythm, normal heart sounds and intact distal pulses.  Exam reveals no gallop and no friction rub.   No murmur heard. Respiratory: Effort normal and breath sounds normal. No respiratory distress. She has no wheezes. She has no rales. She exhibits no tenderness.  GI: Soft. Bowel sounds are normal.  She exhibits no distension and no mass. There is no tenderness. There is no rebound and no guarding.  Neurological: She is alert and oriented to person, place, and time.  Skin: Skin is warm and dry. No rash noted. She is not diaphoretic. No erythema. No pallor.    Cervical Exam:  Dilation: 5 Effacement (%): 90 Station: -2 Presentation: Vertex Exam by:: Thad Ranger MD  Prenatal labs: ABO, Rh: O/POS/-- (03/28 0920) Antibody: NEG (03/28 0920) Rubella: >500.0 (03/28 0920) RPR: NON REAC (06/27 1007)  HBsAg: NEGATIVE (03/28 0920)  HIV: NON REACTIVE (06/27 1007)  GBS:     Assessment/Plan: -- Patient is a 41 y.o., [id number] at [redacted]w[redacted]d who is currently in active labor.         *Plan to admit patient to L&D for NSVD      [name] 05/20/2012, 4:07 AM

## 2012-05-21 LAB — CBC
HCT: 31.3 % — ABNORMAL LOW (ref 36.0–46.0)
Hemoglobin: 10.5 g/dL — ABNORMAL LOW (ref 12.0–15.0)
MCHC: 33.5 g/dL (ref 30.0–36.0)
MCV: 85.8 fL (ref 78.0–100.0)
RDW: 13.5 % (ref 11.5–15.5)

## 2012-05-21 MED ORDER — IBUPROFEN 600 MG PO TABS: 600.0000 mg | ORAL_TABLET | Freq: Four times a day (QID) | ORAL | Status: AC

## 2012-05-21 NOTE — Discharge Summary (Signed)
Obstetric Discharge Summary Reason for Admission: onset of labor Prenatal Procedures: none Intrapartum Procedures: spontaneous vaginal delivery Postpartum Procedures: none Complications-Operative and Postpartum: none Hemoglobin  Date Value Range Status  05/21/2012 10.5* 12.0 - 15.0 g/dL Final     DELTA CHECK NOTED     REPEATED TO VERIFY     HCT  Date Value Range Status  05/21/2012 31.3* 36.0 - 46.0 % Final    Physical Exam:  General: alert and cooperative Lochia: appropriate Uterine Fundus: firm Incision: n/a DVT Evaluation: No evidence of DVT seen on physical exam.  Discharge Diagnoses: Term Pregnancy-delivered  Discharge Information: Date: 05/21/2012 Activity: pelvic rest Diet: routine Medications: Ibuprofen Condition: stable Instructions: refer to practice specific booklet Discharge to: home   Newborn Data: Live born female  Birth Weight: 6 lb 8.9 oz (2975 g) APGAR: 9, 9  Home with mother.  Lawernce Pitts 05/21/2012, 7:15 AM

## 2012-05-21 NOTE — Discharge Summary (Addendum)
I was present for the exam and agree with above.  [place], [place] 05/21/2012 8:58 AM

## 2012-05-21 NOTE — Clinical Social Work Note (Signed)
SW received referral for pt having depression during this pregnancy.  This is pt's sixth child.  Spanish Interpreter was utilized.  Pt denied depression currently or at any other time.  She stated she has had issues with her child's medical care, but does not have depression.  She expressed no needs or concerns at this time. Louie Boston, LCSW

## 2012-05-23 ENCOUNTER — Other Ambulatory Visit: Payer: Self-pay

## 2012-05-23 LAB — TYPE AND SCREEN: Unit division: 0

## 2012-05-23 NOTE — Discharge Summary (Signed)
Agree with above note.  LEGGETT,KELLY H. 05/23/2012 1:09 PM

## 2012-05-23 NOTE — H&P (Signed)
I have seen and examined patient and agree with above. Patient appears to be in active labor with cervical change since yesterday's office visit and during observation in MAU.  Napoleon Form, MD

## 2012-05-23 NOTE — Progress Notes (Signed)
Post discharge chart review completed.  

## 2012-05-30 NOTE — Discharge Summary (Signed)
Medical Screening exam and patient care preformed by advanced practice provider.  Agree with the above management.  

## 2012-06-22 ENCOUNTER — Encounter (HOSPITAL_COMMUNITY): Payer: Self-pay | Admitting: Emergency Medicine

## 2012-06-22 ENCOUNTER — Ambulatory Visit: Payer: Self-pay | Admitting: Obstetrics and Gynecology

## 2012-06-22 ENCOUNTER — Emergency Department (HOSPITAL_COMMUNITY)
Admission: EM | Admit: 2012-06-22 | Discharge: 2012-06-22 | Disposition: A | Discharge status: Home / Self Care | Payer: Self-pay | Attending: Emergency Medicine | Admitting: Emergency Medicine

## 2012-06-22 ENCOUNTER — Encounter: Payer: Self-pay | Admitting: Obstetrics and Gynecology

## 2012-06-22 VITALS — BP 121/70 | HR 80 | Temp 98.6°F | Resp 20 | Ht 60.5 in | Wt 155.3 lb

## 2012-06-22 DIAGNOSIS — J45909 Unspecified asthma, uncomplicated: Secondary | ICD-10-CM | POA: Insufficient documentation

## 2012-06-22 DIAGNOSIS — B373 Candidiasis of vulva and vagina: Secondary | ICD-10-CM

## 2012-06-22 MED ORDER — PREDNISONE 20 MG PO TABS: 60.0000 mg | ORAL_TABLET | Freq: Every day | ORAL | Status: DC

## 2012-06-22 MED ORDER — PREDNISONE 20 MG PO TABS
60.0000 mg | ORAL_TABLET | Freq: Once | ORAL | Status: AC
  Administered 2012-06-22: 60 mg via ORAL
  Filled 2012-06-22: qty 3

## 2012-06-22 MED ORDER — ALBUTEROL SULFATE HFA 108 (90 BASE) MCG/ACT IN AERS
1.0000 | INHALATION_SPRAY | RESPIRATORY_TRACT | Status: DC
  Administered 2012-06-22: 2 via RESPIRATORY_TRACT
  Filled 2012-06-22: qty 6.7

## 2012-06-22 MED ORDER — ALBUTEROL SULFATE (5 MG/ML) 0.5% IN NEBU
5.0000 mg | INHALATION_SOLUTION | Freq: Once | RESPIRATORY_TRACT | Status: AC
  Administered 2012-06-22: 5 mg via RESPIRATORY_TRACT
  Filled 2012-06-22: qty 1

## 2012-06-22 MED ORDER — INFLUENZA VIRUS VACC SPLIT PF IM SUSP: 0.5000 mL | Freq: Once | INTRAMUSCULAR | Status: DC

## 2012-06-22 MED ORDER — IPRATROPIUM BROMIDE 0.02 % IN SOLN: 0.5000 mg | Freq: Once | RESPIRATORY_TRACT | Status: DC

## 2012-06-22 MED ORDER — ALBUTEROL SULFATE (5 MG/ML) 0.5% IN NEBU: 5.0000 mg | INHALATION_SOLUTION | Freq: Once | RESPIRATORY_TRACT | Status: DC

## 2012-06-22 NOTE — ED Provider Notes (Signed)
History   This chart was scribed for Celene Kras, MD by Charolett Bumpers . The patient was seen in room TR09C/TR09C. Patient's care was started at 1916.  CSN: [id number] Arrival date & time 06/22/12  1800  First MD Initiated Contact with Patient 06/22/12 1916      Chief Complaint  Patient presents with  . Asthma    The history is provided by the patient. No language interpreter was used.   Sarah Massey is a 41 y.o. female who presents to the Emergency Department complaining of intermittent, moderate SOB with associated coughing for the past 8 days. Son states the symptoms were worse today. She denies any fever, chest pain, abdominal pain. She has an albuterol inhaler and ventolin inhaler but reports no relief.  Past Medical History  Diagnosis Date  . Gestational diabetes     previous pregnancy  . Asthma   . Urinary tract infection   . Fibroid   . PONV (postoperative nausea and vomiting)   . Depression     pt is depressed about her current pregnancy  . Asthma 01/25/2012    Past Surgical History  Procedure Date  . Dilation and curettage of uterus   . Cesarean section 2005    Family History  Problem Relation Age of Onset  . Anesthesia problems Neg Hx   . Hypertension Mother   . Fibroids Sister     History  Substance Use Topics  . Smoking status: Never Smoker   . Smokeless tobacco: Never Used  . Alcohol Use: No    OB History    Grav Para Term Preterm Abortions TAB SAB Ect Mult Living   9 6 4 2 3  0 3 0 0 6      Review of Systems  Constitutional: Negative for fever and chills.  Respiratory: Positive for cough and shortness of breath.   Cardiovascular: Negative for chest pain.  Gastrointestinal: Negative for nausea, vomiting and abdominal pain.  Neurological: Negative for weakness.  All other systems reviewed and are negative.    Allergies  Review of patient's allergies indicates no known allergies.  Home Medications   Current Outpatient Rx    Name Route Sig Dispense Refill  . ALBUTEROL SULFATE HFA 108 (90 BASE) MCG/ACT IN AERS Inhalation Inhale 2 puffs into the lungs every 4 (four) hours as needed. Patient used this medication to assist with breathing. 1 Inhaler 1  . ALLEGRA PO Oral Take 1 tablet by mouth daily as needed. Patient used this medication for her allergies.    Marland Kitchen PRENATAL 27-0.8 MG PO TABS Oral Take 1 tablet by mouth daily.      BP 126/76  Pulse 93  Temp 97.8 F (36.6 C) (Oral)  Resp 24  SpO2 98%  Physical Exam  Nursing note and vitals reviewed. Constitutional: She appears well-developed and well-nourished. No distress.       Able to speak in full sentences.   HENT:  Head: Normocephalic and atraumatic.  Right Ear: External ear normal.  Left Ear: External ear normal.  Eyes: Conjunctivae normal are normal. Right eye exhibits no discharge. Left eye exhibits no discharge. No scleral icterus.  Neck: Neck supple. No tracheal deviation present.  Cardiovascular: Normal rate, regular rhythm and intact distal pulses.   Pulmonary/Chest: Effort normal. No stridor. No respiratory distress. She has wheezes. She has no rales.       Faint end expiratory wheezes. No accessory muscle use.   Abdominal: Soft. Bowel sounds are normal. She exhibits  no distension. There is no tenderness. There is no rebound and no guarding.  Musculoskeletal: She exhibits no edema and no tenderness.  Neurological: She is alert. She has normal strength. No sensory deficit. Cranial nerve deficit:  no gross defecits noted. She exhibits normal muscle tone. She displays no seizure activity. Coordination normal.  Skin: Skin is warm and dry. No rash noted.  Psychiatric: She has a normal mood and affect.    ED Course  Procedures (including critical care time)  DIAGNOSTIC STUDIES: Oxygen Saturation is 98% on room air, normal by my interpretation.    COORDINATION OF CARE:  19:40-Discussed planned course of treatment with the patient including a  breathing treatment, who is agreeable at this time.    Labs Reviewed - No data to display No results found.   1. Asthma       MDM  She's having a mild asthma attack. On exam she's not having any respiratory difficulty. She is able to speak in full sentences. She is already on her inhalers.  (albuterol and singulair).  Will add course of oral steroids.    I personally performed the services described in this documentation, which was scribed in my presence.  The recorded information has been reviewed and considered.     Celene Kras, MD 06/22/12 2001

## 2012-06-22 NOTE — Progress Notes (Unsigned)
  Subjective:     Sarah Massey is a 41 y.o. female who presents for a postpartum visit. She is 4 week postpartum following a spontaneous vaginal delivery. I have fully reviewed the prenatal and intrapartum course. The delivery was at 38 gestational weeks. Outcome: spontaneous vaginal delivery. Anesthesia: epidural.  Baby is feeding by breast. Bleeding no bleeding. Bowel function is normal. Bladder function is normal. Patient is not sexually active. Contraception method is none and patient would like to get nexplanon howerever she does not have any way to pay for it.  Headaches-Patient reports that she has had headaches 3 days per week with increased ringing in her left ear since her epidural. She is taking about 3g of tylenol per day with minimal relief.   Asthma-Patient's asthma has been increasingly worse since she delivered. She has had to use her albuterol inhaler every 3 hours each day. She reports that she was given a steroid taper during her pregnancy. She also reports having a yearly steroid shot at the health clinic that has since closed.   Vaginal odor- Patient reports having a bad odor from her vagina. She denies any vaginal bleeding/discharge.   Review of Systems Abdominal pain from uterine fibriod No CP, LE edema, dizziness, syncope, vaginal bleeding/dicharge/pain, loss of bowel/bladder continence.   Objective:    BP 121/70  Pulse 80  Temp 98.6 F (37 C) (Oral)  Resp 20  Ht 5' 0.5" (1.537 m)  Wt 70.444 kg (155 lb 4.8 oz)  BMI 29.83 kg/m2  Breastfeeding? Yes  General:  alert, cooperative and no distress   Breasts:  lactation   Lungs: wheezes bilaterally no respiratory distress  Heart:  regular rate and rhythm, S1, S2 normal, no murmur, click, rub or gallop  Abdomen: LLQ tenderness, soft, normal bowel sounds, no masses/organomegaly   Vulva:  normal  Vagina: normal vagina, no discharge, exudate, lesion, or erythema and no noticable malodor laxity and tone, small cystocele  on valsalva   Cervix:  no cervical motion tenderness  Corpus: enlarged, 10 weeks size and fibriod noted  Adnexa:  no mass, fullness, tenderness           Assessment/Plan:   Normal post partum exam- Patient has normal postpartum exam today. Patient would like to have nexplanon but cannot afford it. Patient will need to contact the Health Department in order to work out an affordable means for nexplanon contraception.   Asthma-  Patient having increased wheezing and need for daily (several times per day) use of her albuterol inhaler. No acute exacerbation at this time.  We will prescribe Flovent inhaler for maintenance therapy. We will also refer her to [facility] for further evaluation.   Vaginal odor- Wet prep done today. We will follow-up with these results as needed.   Health maintenance- Patient received flu vaccine today.

## 2012-06-22 NOTE — Progress Notes (Unsigned)
Pt desires flu vaccine today 

## 2012-06-22 NOTE — Patient Instructions (Signed)
Cuidados luego de un parto por va vaginal (Postpartum Care After Vaginal Delivery) Tia Alert del nacimiento del beb deber permanecer en el hospital durante 24 a 72 horas, excepto que hubiera existido algn problema, o usted sufra alguna enfermedad. Mientras se encuentre en el hospital recibir ayuda e instrucciones por parte de las enfermeras y el mdico, quienes cuidarn de usted y su beb y Chief Executive Officer darn consejos para Metallurgist, especialmente si es Financial risk analyst hijo.  En caso de ser necesario, le prescribirn analgsicos. Observar una pequea hemorragia vaginal y deber cambiar los apsitos con frecuencia. Lvese las manos cuidadosamente con agua y jabn durante al menos 20 segundos luego de cambiarse el apsito o ir al bao. Si elimina cogulos o aumenta la hemorragia, infrmelo a la enfermera. No deseche los cogulos sanguneos antes de mostrrselos a la enfermera, para asegurarse de que no es tejido Geologist, engineering. Si le han colocado una va intravenosa, se la retirarn dentro de las 24 horas, si no hay problemas. La primera vez que se levante de la cama o tome una ducha, llame a la enfermera para que la ayude que puede sentirse dbil, mareada o [place]. Si est amamantando, puede sentir contracciones dolorosas en el tero durante algunas semanas. Esto es normal y Medical sales representative, ya que de este modo el tero vuelve a su tamao normal. Si no est amamantando, utilice un sostn de soporte y trate de no tocarse las [employer] que haya dejado de producir Veguita. No deben administrarse hormonas para suprimir la [place], debido a que pueden causar cogulos sanguneos. Podr seguir una dieta normal, excepto que sufra diabetes o presente otros problemas de [place].  La enfermera colocar bolsas con hielo en el sitio de la episiotoma (agrandamiento quirrgico de la apertura vaginal) para reducir Chief Technology Officer y la hinchazn. En algunos casos raros hay dificultad para orinar, entonces la enfermera deber vaciarle la  vejiga con un catter. Si le han practicado una ligadura tubaria durante el posparto ("trompas atadas", esterilizacin femenina), esto no har que permanezca ms [employer] hospital. Podr tener al beb en su habitacin todo el tiempo que lo desee si el beb no tiene ningn problema. Lleve y traiga al beb de la nursery dentro de la [place]. No lo lleve en brazos. No abandone el rea de posparto. Si la madre es Rh negativa (falta de una protena en los glbulos rojos) y el beb es Rh positivo, la madre debe aplicarse la vacuna RhoGam para evitar problemas con el factor Rh en futuros embarazos Le darn instrucciones por escrito para usted y el beb y los medicamentos necesarios cuando reciba el alta mdica. Asegrese que comprende y sigue las indicaciones. INSTRUCCIONES PARA EL CUIDADO DOMICILIARIO  Siga las instrucciones y tome los medicamentos que le indicaron cuando le dieron el alta mdica.   Utilice los medicamentos de venta libre o de prescripcin para Chief Technology Officer, Environmental health practitioner o la [place], segn se lo indique el profesional que lo asiste.   No tome aspirina, ya que puede causar hemorragias.   Aumente sus actividades un poco cada da para tener ms fuerza y Hydrographic surveyor.   No beba alcohol, especialmente si est amamantando o toma analgsicos.   Tmese la [employer] veces por da y Engineering geologist.   Podr tener una pequea hemorragia durante 2 a 4 semanas. Esto es normal.   No utilice tampones o duchas vaginales, use toallas higinicas.   Trate de que Therapist, nutritional con usted y la ayude durante los primeros das en el hogar.  Descanse o duerma una siesta cuando el beb duerma.   Si est amamantando, use un buen sostn. Si no est amamantando, use un buen sostn y no estimule los pezones.   Consuma una dieta sana y siga tomando las vitaminas prenatales.   No conduzca vehculos, no realice actividades pesadas ni viaje hasta que su mdico la autorice.   No mantenga relaciones  sexuales hasta que el mdico lo permita.   Consulte con el profesional cuando puede comenzar a Education officer, environmental actividad fsica y que tipo de Glass blower/designer.   Comunquese inmediatamente con el mdico si tiene problemas luego del [place].   Comunquese con el pediatra si tiene problemas con el beb.   Programe su visita de control luego del parto y cmplala.  SOLICITE ATENCIN MDICA SI:  La temperatura se eleva por encima de 100 F (37.8 C).   Aumenta la hemorragia vaginal o elimina cogulos. Conserve algunos cogulos para mostrrselos al mdico.   Anola Gurney sangre o siente dolor al Geographical information systems officer.   Presenta secrecin vaginal con olor ftido.   Aumenta el dolor o la inflamacin en el sitio de la episiotoma (agrandamiento quirrgico de la apertura vaginal).   Sufre una cefalea grave.   Se siente deprimida.   La incisin se abre.   Se siente mareada o sufre un desmayo.   Aparece una erupcin cutnea.   Tiene una reaccin o problemas con su medicamento.   Siente dolor u observa enrojecimiento e hinchazn en el sitio de la va intravenosa.  SOLICITE ATENCIN MDICA DE INMEDIATO SI:  Siente dolor en el pecho.   Comienza a sentir falta de aire.   Se desmaya.   Siente dolor, con o sin hinchazn e irritacin en la pierna.   Tiene una hemorragia vaginal abundante, con o sin cogulos   IT consultant.   Brett Fairy secrecin vaginal con mal olor.  ASEGURESE QUE:   Comprende estas instrucciones.   Controlar su enfermedad.   Solicitar ayuda de inmediato si no mejora o empeora.  Document Released: 06/28/2007 Document Revised: 08/20/2011 [facility] Patient Information 2012 [place], [place].Asma, broncoespasmo agudo (Asthma, Acute Bronchospasm) Sus exmenes confirman que usted sufre asma o broncoespasmo agudo, que es similar al asma. Broncoespasmo significa que las vas areas se han estrechado. Estas enfermedades se deben a la inflamacin y espasmo de las vas areas  que producen el estrechamiento de los conductos bronquiales en los pulmones. Esto le produce ruidos como silbidos y falta de [place]. CAUSAS Infecciones respiratorias y [place] son las causas ms frecuentes de los ataques. El hbito de fumar, la polucin del aire, [place] fro, los problemas emocionales y la actividad fsica extenuante tambin pueden provocarlos.  TRATAMIENTO  El tratamiento est dirigido a Clinical biochemist las vas areas. El asma o broncoespasmo leves generalmente pueden controlarse con inhalantes. El Albuterol es un medicamento comn que se inhala para abrir las vas areas espsticas o Solicitor. Algunas marcas de albuterol son Ventolin o Proventil. Los corticoides tambin se utilizan para reducir la inflamacin cuando un ataque es moderado o grave. Puede ser necesario el uso de antibiticos (medicamentos que destruyen grmenes) slo si hay infeccin.  Si est embarazada y IT trainer Albuterol (Ventolin or Proventil), puede esperar que el beb se mueva ms de lo habitual poco despus de Astronomer. INSTRUCCIONES PARA EL CUIDADO DOMICILIARIO  Reposo.  Beba lquido en abundancia. Esto ayuda a Medical illustrator y a Pensions consultant. No ingiera cafena ni alcohol.  No fume. Evite la exposicin a humo  de [employer].  Usted tiene un rol fundamental en mantener bien su salud. Evite la exposicin a lo que le causa los silbidos (sibilancias). Evite la exposicin a lo que [employer] respiratorios. Mantenga los medicamentos actualizados y a Designer, fashion/clothing. Siga cuidadosamente el plan de tratamiento que le indique su mdico.  Cuando haya mucho polen o polucin, mantenga las ventanas cerradas y use el aire acondicionado siempre que lo encuentre disponible. Si es alrgico a la piel de Neurosurgeon o pjaros, bsqueles un hogar sustituto o Loss adjuster, chartered.  Utilice los medicamentos tal como se le indic.  El asma requiere atencin [place]. Concurra a  los controles segn las indicaciones. Si tiene Chartered loss adjuster de ms de 24 semanas y le han prescripto medicamentos nuevos, comntelo con su obstetra. Solicite turno para un nuevo control. SOLICITE ATENCIN MDICA DE INMEDIATO SI:  Siente que empeora.  Presenta dificultades respiratorias. Si son graves, comunquese al 911.  Siente dolor o International aid/development worker.  Vomita o no bebe lquidos.  No mejora dentro de las 24 horas.  El nio tiene un catarro verde, [place], amarronado o esputo sanguinolento.  Presenta una temperatura persistente superior a 102 F (38.9 C).  Presenta dificultad para tragar. ASEGRESE QUE:   Comprende esas instrucciones.  Controlar su enfermedad.  Solicitar ayuda de inmediato si no mejora o si empeora. Document Released: 12/17/2008 Document Revised: 11/23/2011 [facility] Patient Information 2013 [place], [place].

## 2012-06-22 NOTE — ED Notes (Signed)
Pt states that she has been having symptoms for 8 days, but today it got worse. Pt has albuterol inhaler and ventolin inhaler but states she is not getting any relief. Pt states she is having chest tightness.

## 2012-06-23 LAB — WET PREP, GENITAL: Trich, Wet Prep: NONE SEEN

## 2013-05-22 ENCOUNTER — Encounter (HOSPITAL_COMMUNITY): Payer: Self-pay | Admitting: Emergency Medicine

## 2013-05-22 ENCOUNTER — Emergency Department (HOSPITAL_COMMUNITY)
Admission: EM | Admit: 2013-05-22 | Discharge: 2013-05-22 | Disposition: A | Discharge status: Home / Self Care | Payer: PRIVATE HEALTH INSURANCE | Attending: Emergency Medicine | Admitting: Emergency Medicine

## 2013-05-22 ENCOUNTER — Emergency Department (HOSPITAL_COMMUNITY): Payer: PRIVATE HEALTH INSURANCE

## 2013-05-22 DIAGNOSIS — Y9389 Activity, other specified: Secondary | ICD-10-CM | POA: Insufficient documentation

## 2013-05-22 DIAGNOSIS — Z79899 Other long term (current) drug therapy: Secondary | ICD-10-CM | POA: Insufficient documentation

## 2013-05-22 DIAGNOSIS — IMO0002 Reserved for concepts with insufficient information to code with codable children: Secondary | ICD-10-CM | POA: Insufficient documentation

## 2013-05-22 DIAGNOSIS — Z8632 Personal history of gestational diabetes: Secondary | ICD-10-CM | POA: Insufficient documentation

## 2013-05-22 DIAGNOSIS — J45909 Unspecified asthma, uncomplicated: Secondary | ICD-10-CM | POA: Insufficient documentation

## 2013-05-22 DIAGNOSIS — Z8742 Personal history of other diseases of the female genital tract: Secondary | ICD-10-CM | POA: Insufficient documentation

## 2013-05-22 DIAGNOSIS — W19XXXA Unspecified fall, initial encounter: Secondary | ICD-10-CM | POA: Insufficient documentation

## 2013-05-22 DIAGNOSIS — Y929 Unspecified place or not applicable: Secondary | ICD-10-CM | POA: Insufficient documentation

## 2013-05-22 DIAGNOSIS — Z87440 Personal history of urinary (tract) infections: Secondary | ICD-10-CM | POA: Insufficient documentation

## 2013-05-22 DIAGNOSIS — S8392XA Sprain of unspecified site of left knee, initial encounter: Secondary | ICD-10-CM

## 2013-05-22 MED ORDER — TRAMADOL HCL 50 MG PO TABS: 50.0000 mg | ORAL_TABLET | Freq: Four times a day (QID) | ORAL | Status: DC | PRN

## 2013-05-22 NOTE — ED Notes (Signed)
Pt c/o left sided knee and ankle pain after falling today; CMS intact

## 2013-05-22 NOTE — ED Provider Notes (Signed)
CSN: [id number]     Arrival date & time 05/22/13  1830 History  This chart was scribed for non-physician practitioner Wynetta Emery, PA-C working with Loren Racer, MD by Leone Payor, ED Scribe. This patient was seen in room TR06C/TR06C and the patient's care was started at 1830.    Chief Complaint  Patient presents with  . Knee Pain  . Fall    The history is provided by a relative and the patient. No language interpreter was used.   HPI Comments: Sarah Massey is a 42 y.o. female who presents to the Emergency Department complaining of a fall that occurred today. She now complains of constant, unchanged left knee pain. Rated at 7/10, exacerbated by palpation weightbearing. Pt has no h/o past knee injuries. She has taken tylenol about 3 hours ago with mild relief. Pt denies numbness, weakness, head trauma, LOC, cervicalgia, chest pain, shortness of breath, abdominal pain.  Past Medical History  Diagnosis Date  . Gestational diabetes     previous pregnancy  . Asthma   . Urinary tract infection   . Fibroid   . PONV (postoperative nausea and vomiting)   . Depression     pt is depressed about her current pregnancy  . Asthma 01/25/2012   Past Surgical History  Procedure Laterality Date  . Dilation and curettage of uterus    . Cesarean section  2005   Family History  Problem Relation Age of Onset  . Anesthesia problems Neg Hx   . Hypertension Mother   . Fibroids Sister    History  Substance Use Topics  . Smoking status: Never Smoker   . Smokeless tobacco: Never Used  . Alcohol Use: No   OB History   Grav Para Term Preterm Abortions TAB SAB Ect Mult Living   9 6 4 2 3  0 3 0 0 6     Review of Systems A complete 10 system review of systems was obtained and all systems are negative except as noted in the HPI and PMH.   Allergies  Review of patient's allergies indicates no known allergies.  Home Medications   Current Outpatient Rx  Name  Route  Sig  Dispense   Refill  . albuterol (PROVENTIL HFA;VENTOLIN HFA) 108 (90 BASE) MCG/ACT inhaler   Inhalation   Inhale 2 puffs into the lungs every 4 (four) hours as needed. Patient used this medication to assist with breathing.   1 Inhaler   1   . Fexofenadine HCl (ALLEGRA PO)   Oral   Take 1 tablet by mouth daily as needed. Patient used this medication for her allergies.         . predniSONE (DELTASONE) 20 MG tablet   Oral   Take 3 tablets (60 mg total) by mouth daily.   15 tablet   0    Triage Vitals:BP 112/60  Pulse 77  Temp(Src) 98.3 F (36.8 C) (Oral)  Resp 16  SpO2 97%  Physical Exam  Nursing note and vitals reviewed. Constitutional: She is oriented to person, place, and time. She appears well-developed and well-nourished. No distress.  HENT:  Head: Normocephalic and atraumatic.  Mouth/Throat: Oropharynx is clear and moist.  Eyes: Conjunctivae and EOM are normal. Pupils are equal, round, and reactive to light.  Cardiovascular: Normal rate.   Pulmonary/Chest: Effort normal and breath sounds normal. No stridor. No respiratory distress. She has no wheezes. She has no rales. She exhibits no tenderness.  Abdominal: Soft. Bowel sounds are normal. She exhibits no  distension and no mass. There is no tenderness. There is no rebound and no guarding.  Musculoskeletal: Normal range of motion.  Left Knee: No deformity, erythema or abrasions. FROM. No effusion or crepitance. Anterior and posterior drawer show no abnormal laxity. Stable to valgus and varus stress. Joint lines are non-tender. Neurovascularly intact. Pt ambulates with non-antalgic gait.    Neurological: She is alert and oriented to person, place, and time.  Psychiatric: She has a normal mood and affect.     ED Course  Procedures (including critical care time)  DIAGNOSTIC STUDIES: Oxygen Saturation is 97% on RA, normal by my interpretation.    COORDINATION OF CARE: 6:57 PM Will order X-ray of the left knee.Discussed treatment  plan with pt at bedside and pt agreed to plan.  Labs Review Labs Reviewed - No data to display Imaging Review Dg Knee Complete 4 Views Left  05/22/2013   *RADIOLOGY REPORT*  Clinical Data: Knee pain  LEFT KNEE - COMPLETE 4+ VIEW  Comparison: None  Findings: There is no joint effusion.  No fracture or subluxations identified.  There is a 3 mm lucency along the undersurface of the patella.  Sharpening tibial spines and marginal spur formation is noted.  No radiopaque foreign body or soft tissue calcification.  IMPRESSION:  1.  No acute findings. 2.  Osteoarthritis. 3.  Lucency along the undersurface of the patella is noted which could represent an osteochondral defect.   Original Report Authenticated By: Signa Kell, M.D.    MDM   1. Knee sprain and strain, left, initial encounter     Filed Vitals:   05/22/13 1839  BP: 112/60  Pulse: 77  Temp: 98.3 F (36.8 C)  TempSrc: Oral  Resp: 16  SpO2: 97%     Sarah Massey is a 42 y.o. female with left knee pain status post fall earlier in the day. Patient has normal knee exam however she is exquisitely tender to palpation. Place him as negative. Patient will be given crutches and a knee sleeve. Over her for tramadol. Patient is breast-feeding I have advised her to pump and dump if she is going to use the  Tramadol. Patient verbalized understanding.  Pt is hemodynamically stable, appropriate for, and amenable to discharge at this time. Pt verbalized understanding and agrees with care plan. All questions answered. Outpatient follow-up and specific return precautions discussed.    Discharge Medication List as of 05/22/2013  9:03 PM    START taking these medications   Details  traMADol (ULTRAM) 50 MG tablet Take 1 tablet (50 mg total) by mouth every 6 (six) hours as needed for pain., Starting 05/22/2013, Until Discontinued, Print        I personally performed the services described in this documentation, which was scribed in my presence. The  recorded information has been reviewed and is accurate.  Note: Portions of this report may have been transcribed using voice recognition software. Every effort was made to ensure accuracy; however, inadvertent computerized transcription errors may be present     Wynetta Emery, PA-C 05/24/13 0026

## 2013-05-24 NOTE — ED Provider Notes (Signed)
Medical screening examination/treatment/procedure(s) were performed by non-physician practitioner and as supervising physician I was immediately available for consultation/collaboration.   Jemario Poitras, MD 05/24/13 1517 

## 2013-06-27 IMAGING — US US TRANSVAGINAL NON-OB
1 series · 14 of 25 positions shown · non-contrast
Comparison: 11/20/2009

CLINICAL DATA: Pelvic pain

TRANSABDOMINAL AND TRANSVAGINAL ULTRASOUND OF PELVIS
TECHNIQUE: Both transabdominal and transvaginal ultrasound
examinations of the pelvis were performed. Transabdominal technique
was performed for global imaging of the pelvis including uterus,
ovaries, adnexal regions, and pelvic cul-de-sac.

[Series 1: us transvaginal non-ob · 0.28mm/px · 14 of 59 slices shown]
[im 1/59]
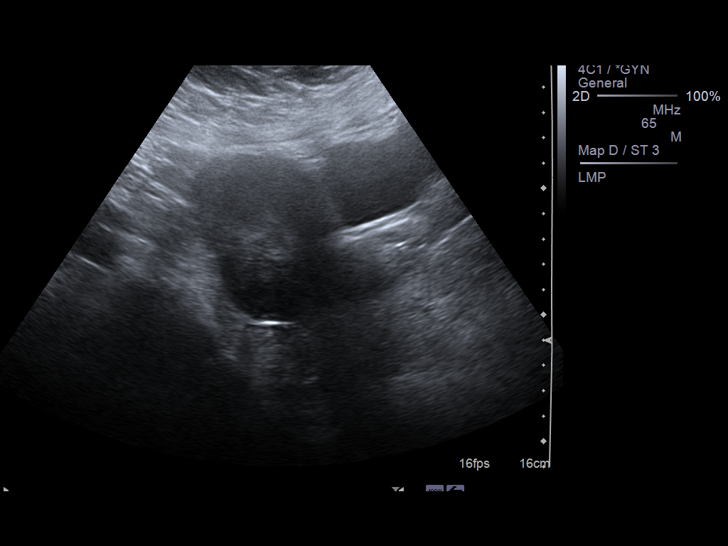
[im 5/59]
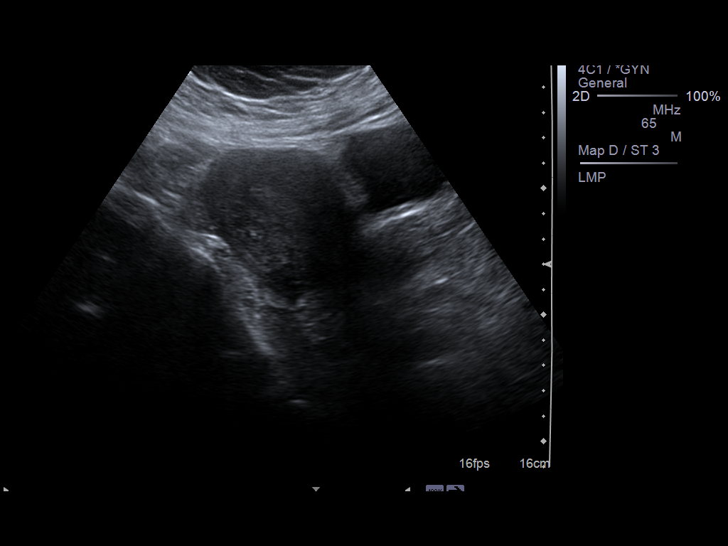
[im 10/59]
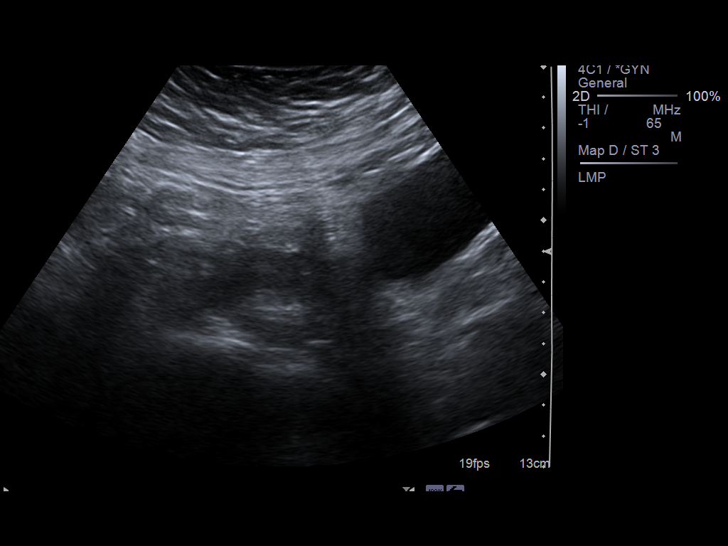
[im 15/59]
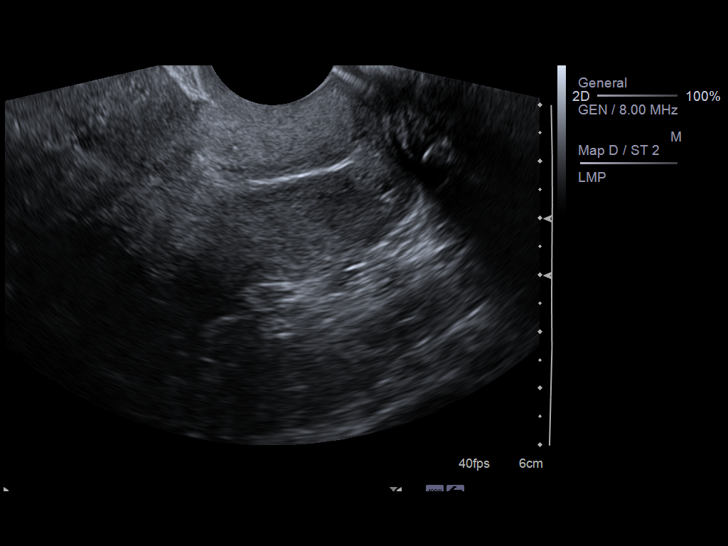
[im 20/59]
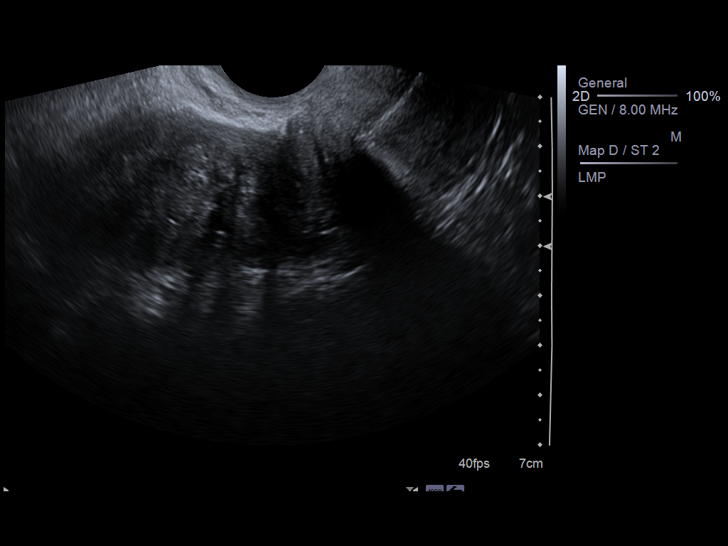
[im 22/59]
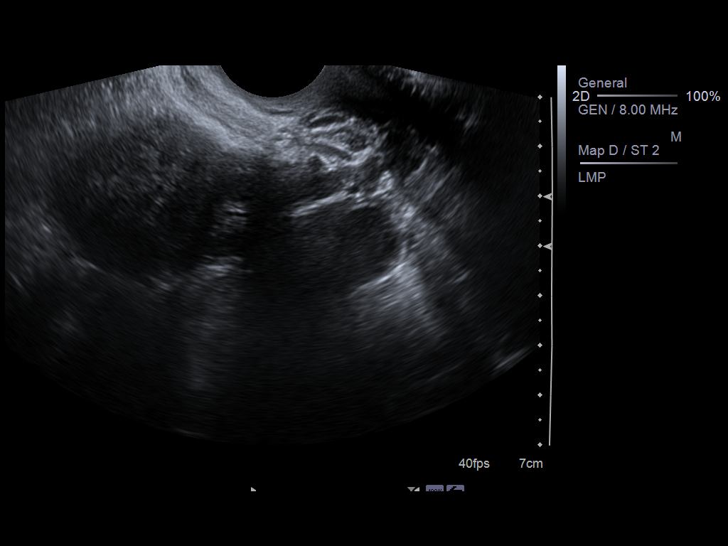
[im 27/59]
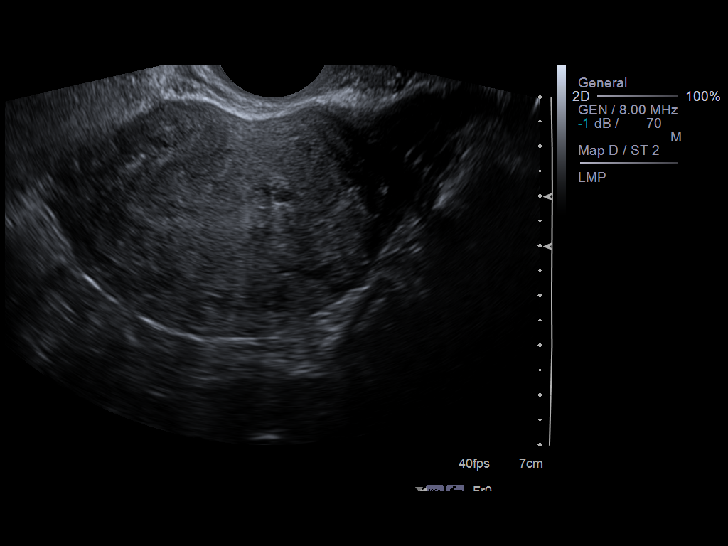
[im 32/59]
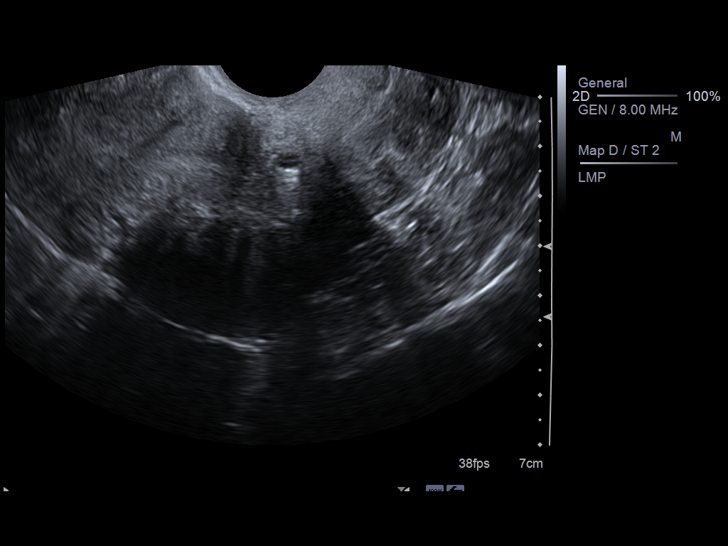
[im 37/59]
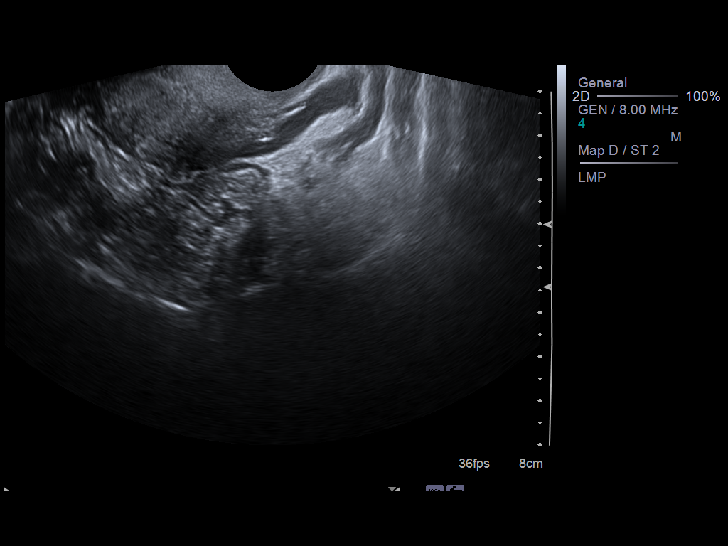
[im 39/59]
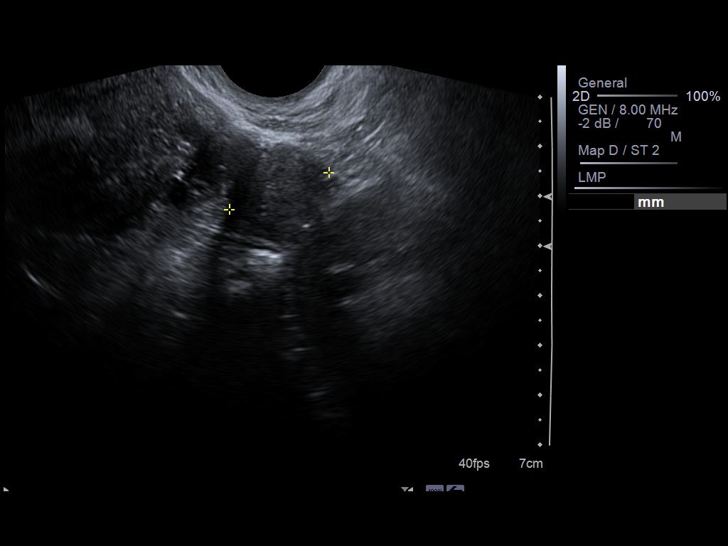
[im 44/59]
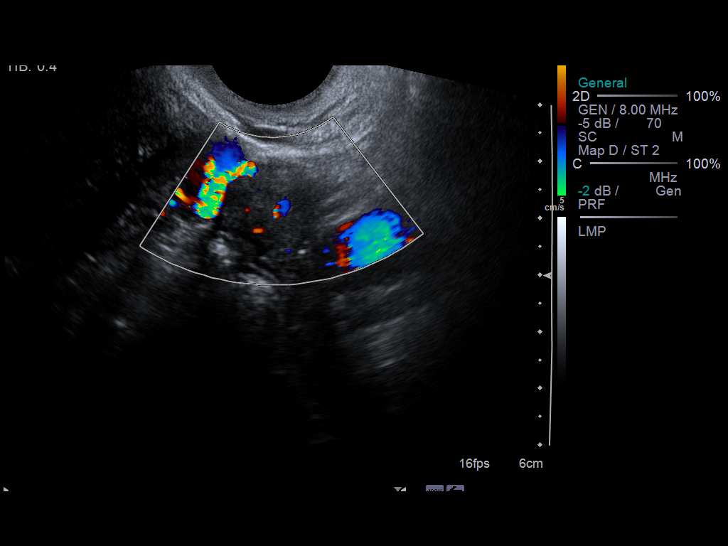
[im 49/59]
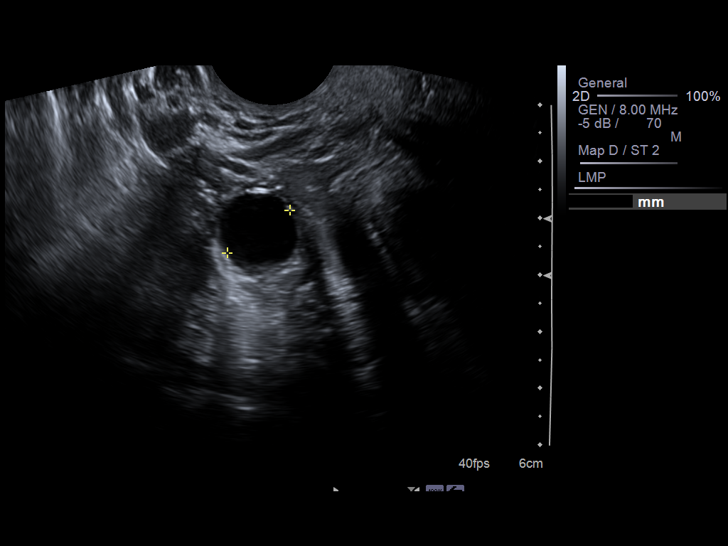
[im 54/59]
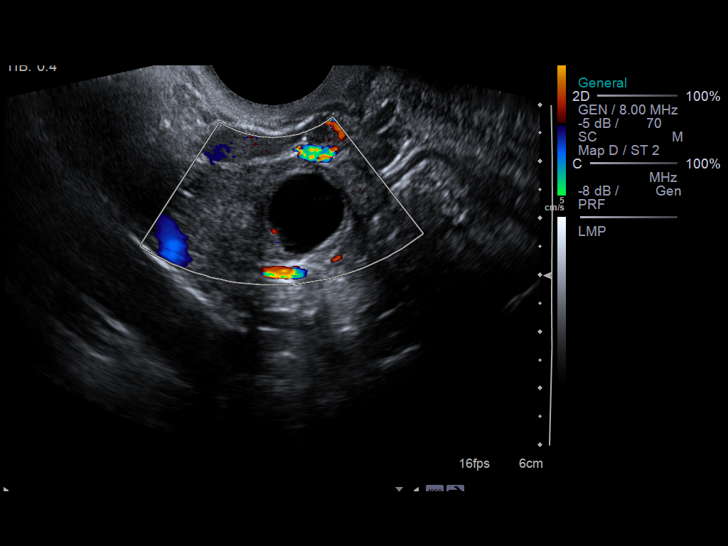
[im 59/59]
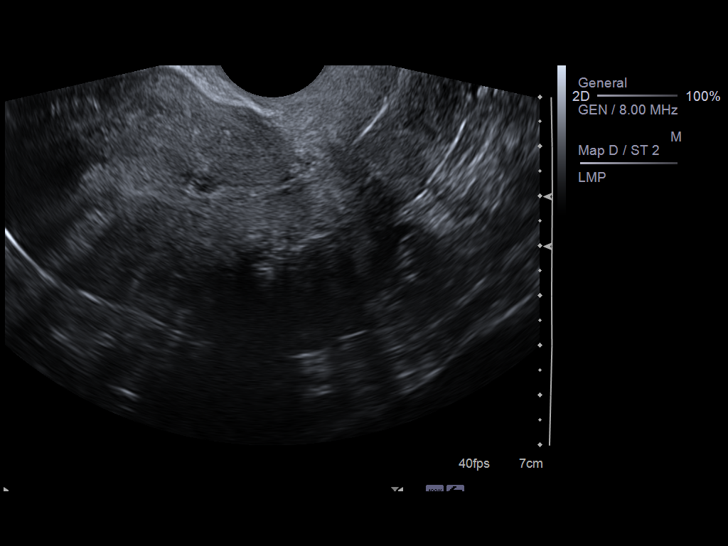

[14 of 25 positions shown; findings below may reference images not displayed]

It was necessary to proceed with endovaginal exam following the
transabdominal exam to visualize the endometrium and ovaries.
FINDINGS: Uterus: 9.5 cm length by 4.6 cm AP by 6.9 cm transverse. Probable
leiomyoma posteriorly at mid uterine segment, 4.2 x 2.6 x 3.5 cm.

Endometrium: 9 mm thick, normal.  No endometrial fluid.

Right ovary:  2.4 x 1.6 x 1.6 cm.  Small follicles cyst 1.5 x 1.5 x
1.3 cm.  No additional ovarian mass.

Left ovary: 2.9 x 2.1 x 2.1 cm.  Normal morphology without mass.

Other findings: No free fluid
IMPRESSION: Probable posterior mid uterine leiomyoma.
Otherwise no significant sonographic abnormalities identified.

## 2013-12-31 ENCOUNTER — Emergency Department (HOSPITAL_COMMUNITY)
Admission: EM | Admit: 2013-12-31 | Discharge: 2013-12-31 | Disposition: A | Discharge status: Home / Self Care | Payer: No Typology Code available for payment source | Attending: Emergency Medicine | Admitting: Emergency Medicine

## 2013-12-31 ENCOUNTER — Emergency Department (HOSPITAL_COMMUNITY): Payer: PRIVATE HEALTH INSURANCE

## 2013-12-31 ENCOUNTER — Encounter (HOSPITAL_COMMUNITY): Payer: Self-pay | Admitting: Emergency Medicine

## 2013-12-31 DIAGNOSIS — Z8659 Personal history of other mental and behavioral disorders: Secondary | ICD-10-CM | POA: Insufficient documentation

## 2013-12-31 DIAGNOSIS — J45909 Unspecified asthma, uncomplicated: Secondary | ICD-10-CM | POA: Insufficient documentation

## 2013-12-31 DIAGNOSIS — Z8632 Personal history of gestational diabetes: Secondary | ICD-10-CM | POA: Insufficient documentation

## 2013-12-31 DIAGNOSIS — Z3202 Encounter for pregnancy test, result negative: Secondary | ICD-10-CM | POA: Insufficient documentation

## 2013-12-31 DIAGNOSIS — D259 Leiomyoma of uterus, unspecified: Secondary | ICD-10-CM | POA: Insufficient documentation

## 2013-12-31 DIAGNOSIS — Z87440 Personal history of urinary (tract) infections: Secondary | ICD-10-CM | POA: Insufficient documentation

## 2013-12-31 LAB — URINALYSIS, ROUTINE W REFLEX MICROSCOPIC
Bilirubin Urine: NEGATIVE
Glucose, UA: NEGATIVE mg/dL
HGB URINE DIPSTICK: NEGATIVE
Ketones, ur: NEGATIVE mg/dL
Leukocytes, UA: NEGATIVE
NITRITE: NEGATIVE
Protein, ur: NEGATIVE mg/dL
SPECIFIC GRAVITY, URINE: 1.012 (ref 1.005–1.030)
UROBILINOGEN UA: 0.2 mg/dL (ref 0.0–1.0)
pH: 7.5 (ref 5.0–8.0)

## 2013-12-31 LAB — COMPREHENSIVE METABOLIC PANEL
ALT: 16 U/L (ref 0–35)
AST: 20 U/L (ref 0–37)
Albumin: 4.2 g/dL (ref 3.5–5.2)
Alkaline Phosphatase: 72 U/L (ref 39–117)
BUN: 10 mg/dL (ref 6–23)
CALCIUM: 9.3 mg/dL (ref 8.4–10.5)
CO2: 24 meq/L (ref 19–32)
CREATININE: 0.58 mg/dL (ref 0.50–1.10)
Chloride: 102 mEq/L (ref 96–112)
Glucose, Bld: 104 mg/dL — ABNORMAL HIGH (ref 70–99)
Potassium: 4.2 mEq/L (ref 3.7–5.3)
Sodium: 140 mEq/L (ref 137–147)
Total Bilirubin: 0.3 mg/dL (ref 0.3–1.2)
Total Protein: 7.9 g/dL (ref 6.0–8.3)

## 2013-12-31 LAB — CBC WITH DIFFERENTIAL/PLATELET
BASOS ABS: 0 10*3/uL (ref 0.0–0.1)
Basophils Relative: 1 % (ref 0–1)
EOS ABS: 0.2 10*3/uL (ref 0.0–0.7)
EOS PCT: 4 % (ref 0–5)
HEMATOCRIT: 36.3 % (ref 36.0–46.0)
Hemoglobin: 12.8 g/dL (ref 12.0–15.0)
LYMPHS PCT: 51 % — AB (ref 12–46)
Lymphs Abs: 3.1 10*3/uL (ref 0.7–4.0)
MCH: 30.9 pg (ref 26.0–34.0)
MCHC: 35.3 g/dL (ref 30.0–36.0)
MCV: 87.7 fL (ref 78.0–100.0)
MONO ABS: 0.4 10*3/uL (ref 0.1–1.0)
Monocytes Relative: 7 % (ref 3–12)
Neutro Abs: 2.2 10*3/uL (ref 1.7–7.7)
Neutrophils Relative %: 37 % — ABNORMAL LOW (ref 43–77)
Platelets: 274 10*3/uL (ref 150–400)
RBC: 4.14 MIL/uL (ref 3.87–5.11)
RDW: 13 % (ref 11.5–15.5)
WBC: 6 10*3/uL (ref 4.0–10.5)

## 2013-12-31 LAB — POC URINE PREG, ED: PREG TEST UR: NEGATIVE

## 2013-12-31 MED ORDER — SODIUM CHLORIDE 0.9 % IV SOLN
1000.0000 mL | INTRAVENOUS | Status: DC
  Administered 2013-12-31: 1000 mL via INTRAVENOUS

## 2013-12-31 MED ORDER — HYDROCODONE-ACETAMINOPHEN 5-325 MG PO TABS: 1.0000 | ORAL_TABLET | ORAL | Status: DC | PRN

## 2013-12-31 MED ORDER — HYDROMORPHONE HCL PF 1 MG/ML IJ SOLN
0.5000 mg | INTRAMUSCULAR | Status: DC | PRN
  Administered 2013-12-31: 0.5 mg via INTRAVENOUS
  Filled 2013-12-31: qty 1

## 2013-12-31 MED ORDER — ONDANSETRON HCL 4 MG/2ML IJ SOLN
4.0000 mg | Freq: Once | INTRAMUSCULAR | Status: AC
  Administered 2013-12-31: 4 mg via INTRAVENOUS
  Filled 2013-12-31: qty 2

## 2013-12-31 MED ORDER — NAPROXEN 500 MG PO TABS: 500.0000 mg | ORAL_TABLET | Freq: Two times a day (BID) | ORAL | Status: DC

## 2013-12-31 MED ORDER — SODIUM CHLORIDE 0.9 % IV SOLN
1000.0000 mL | Freq: Once | INTRAVENOUS | Status: AC
  Administered 2013-12-31: 1000 mL via INTRAVENOUS

## 2013-12-31 NOTE — ED Notes (Signed)
Dr. Tomi Bamberger back in to discuss discharge with the patient.

## 2013-12-31 NOTE — ED Notes (Signed)
Patient presents to ED via POV from home with complaints of abdominal pain from fibroids for the past couple days.

## 2013-12-31 NOTE — ED Provider Notes (Signed)
CSN: [id number]     Arrival date & time 12/31/13  6301 History   First MD Initiated Contact with Patient 12/31/13 [phone]     Chief Complaint  Patient presents with  . Abdominal Pain   HPI Pt was told 9 years ago that she had a fibroid.  She has had intermittent trouble with pain in her lower abdomen.  Previously surgery was recommended.  She has pain every month during her cycle.  Since yesterday she has had more severe pain in her lower abdomen.  It feels as if she is giving birth.  LMP was April 7-14.    No vomiting or diarrhea.  No fever.  No dysuria.    The pain is sharp in her lower left abdomen.  It radiates to the back.  Her leg feels like it will go numb sometimes too.    She has been taking tylenol and motrin without significant relief. Past Medical History  Diagnosis Date  . Gestational diabetes     previous pregnancy  . Asthma   . Urinary tract infection   . Fibroid   . PONV (postoperative nausea and vomiting)   . Depression     pt is depressed about her current pregnancy  . Asthma 01/25/2012   Past Surgical History  Procedure Laterality Date  . Dilation and curettage of uterus    . Cesarean section  2005   Family History  Problem Relation Age of Onset  . Anesthesia problems Neg Hx   . Hypertension Mother   . Fibroids Sister    History  Substance Use Topics  . Smoking status: Never Smoker   . Smokeless tobacco: Never Used  . Alcohol Use: No   OB History   Grav Para Term Preterm Abortions TAB SAB Ect Mult Living   9 6 4 2 3  0 3 0 0 6     Review of Systems  All other systems reviewed and are negative.     Allergies  Review of patient's allergies indicates no known allergies.  Home Medications   Prior to Admission medications   Medication Sig Start Date End Date Taking? Authorizing Provider         Fexofenadine HCl (ALLEGRA PO) Take 1 tablet by mouth daily as needed. Patient used this medication for her allergies.    Historical Provider, MD              BP 109/87  Pulse 55  Temp(Src) 98 F (36.7 C) (Oral)  Resp 18  SpO2 98% Physical Exam  Nursing note and vitals reviewed. Constitutional: She appears well-developed and well-nourished. No distress.  HENT:  Head: Normocephalic and atraumatic.  Right Ear: External ear normal.  Left Ear: External ear normal.  Eyes: Conjunctivae are normal. Right eye exhibits no discharge. Left eye exhibits no discharge. No scleral icterus.  Neck: Neck supple. No tracheal deviation present.  Cardiovascular: Normal rate, regular rhythm and intact distal pulses.   Pulmonary/Chest: Effort normal and breath sounds normal. No stridor. No respiratory distress. She has no wheezes. She has no rales.  Abdominal: Soft. Bowel sounds are normal. She exhibits no distension. There is tenderness in the left lower quadrant. There is guarding. There is no rigidity and no rebound. No hernia. Hernia confirmed negative in the right inguinal area and confirmed negative in the left inguinal area.  Genitourinary: Vagina normal. Uterus is deviated (toward the left) and tender. Cervix exhibits no motion tenderness and no discharge. Right adnexum displays no mass. Left adnexum  displays no mass.  Musculoskeletal: She exhibits no edema and no tenderness.  Neurological: She is alert. She has normal strength. No cranial nerve deficit (no facial droop, extraocular movements intact, no slurred speech) or sensory deficit. She exhibits normal muscle tone. She displays no seizure activity. Coordination normal.  Skin: Skin is warm and dry. No rash noted.  Psychiatric: She has a normal mood and affect.    ED Course  Procedures (including critical care time) Labs Review Labs Reviewed  CBC WITH DIFFERENTIAL - Abnormal; Notable for the following:    Neutrophils Relative % 37 (*)    Lymphocytes Relative 51 (*)    All other components within normal limits  COMPREHENSIVE METABOLIC PANEL - Abnormal; Notable for the following:    Glucose, Bld  104 (*)    All other components within normal limits  GC/CHLAMYDIA PROBE AMP  URINALYSIS, ROUTINE W REFLEX MICROSCOPIC  POC URINE PREG, ED    Imaging Review US Transvaginal Non-ob  12/31/2013   CLINICAL DATA:  Pelvic pain, left side greater than right. Fibroids. LMP 12/19/2013.  EXAM: TRANSABDOMINAL AND TRANSVAGINAL ULTRASOUND OF PELVIS  TECHNIQUE: Both transabdominal and transvaginal ultrasound examinations of the pelvis were performed. Transabdominal technique was performed for global imaging of the pelvis including uterus, ovaries, adnexal regions, and pelvic cul-de-sac. It was necessary to proceed with endovaginal exam following the transabdominal exam to visualize the small fibroids and ovaries.  COMPARISON:  None  FINDINGS: Uterus  Measurements: 9.6 x 5.5 x 5.7 cm. A subserosal fibroid is seen in the posterior lower uterine segment which measures 3.8 cm in maximum diameter. No other fibroids identified.  Endometrium  Thickness: 11 mm.  No focal abnormality visualized.  Right ovary  Measurements: 2.8 x 1.8 x 1.9 cm. 2 cm benign appearing cyst or follicle noted. Normal appearance/no adnexal mass.  Left ovary  Measurements: 3.7 x 2.0 x 2.0 cm. Normal appearance/no adnexal mass.  Other findings  No free fluid.  IMPRESSION: 3.1 cm subserosal fibroid in the posterior lower uterine segment.  Normal appearance of both ovaries.  No adnexal mass identified.   Electronically Signed   By: Earle Gell M.D.   On: 12/31/2013 12:04   US Pelvis Complete  12/31/2013   CLINICAL DATA:  Pelvic pain, left side greater than right. Fibroids. LMP 12/19/2013.  EXAM: TRANSABDOMINAL AND TRANSVAGINAL ULTRASOUND OF PELVIS  TECHNIQUE: Both transabdominal and transvaginal ultrasound examinations of the pelvis were performed. Transabdominal technique was performed for global imaging of the pelvis including uterus, ovaries, adnexal regions, and pelvic cul-de-sac. It was necessary to proceed with endovaginal exam following the  transabdominal exam to visualize the small fibroids and ovaries.  COMPARISON:  None  FINDINGS: Uterus  Measurements: 9.6 x 5.5 x 5.7 cm. A subserosal fibroid is seen in the posterior lower uterine segment which measures 3.8 cm in maximum diameter. No other fibroids identified.  Endometrium  Thickness: 11 mm.  No focal abnormality visualized.  Right ovary  Measurements: 2.8 x 1.8 x 1.9 cm. 2 cm benign appearing cyst or follicle noted. Normal appearance/no adnexal mass.  Left ovary  Measurements: 3.7 x 2.0 x 2.0 cm. Normal appearance/no adnexal mass.  Other findings  No free fluid.  IMPRESSION: 3.1 cm subserosal fibroid in the posterior lower uterine segment.  Normal appearance of both ovaries.  No adnexal mass identified.   Electronically Signed   By: Earle Gell M.D.   On: 12/31/2013 12:04   Medications  0.9 %  sodium chloride infusion (0 mLs Intravenous  Stopped 12/31/13 1234)    Followed by  0.9 %  sodium chloride infusion (1,000 mLs Intravenous New Bag/Given 12/31/13 1234)  HYDROmorphone (DILAUDID) injection 0.5 mg (0.5 mg Intravenous Given 12/31/13 0850)  ondansetron (ZOFRAN) injection 4 mg (4 mg Intravenous Given 12/31/13 0850)     MDM   Final diagnoses:  Uterine fibroid    Labs are unremarkable.  [place] confirms a uterine fibroid.  No ovarian abnormality noted.  Doubt diveritculitis.  Will dc home with pain meds.  Follow up with an OB GYN doctor    Kathalene Frames, MD 12/31/13 1257

## 2013-12-31 NOTE — Discharge Instructions (Signed)
Fibromas (Fibroids) Los fibromas son bultos (tumores) que pueden Administrator del cuerpo de Musician. Estos tumores no son cancerosos. Pueden variar en tamao, peso y lugar en el que crecen. CUIDADOS EN EL HOGAR  No tome aspirina.  Anote el nmero de apsitos o tampones que [place] durante el perodo. Infrmelo a su mdico. Esto puede ayudar a determinar el mejor tratamiento para usted. SOLICITE AYUDA DE INMEDIATO SI:  Siente dolor en la zona inferior del vientre (abdomen) y no se alivia con analgsicos.  Tiene clicos que no se calman con medicamentos  Aumenta el sangrado entre perodos o durante el mismo.  Sufre mareos o se desvanece (se desmaya).  El dolor en el vientre [place]. ASEGRESE DE QUE:  Comprende estas instrucciones.  Controlar su enfermedad.  Solicitar ayuda de inmediato si no mejora o empeora. Document Released: 12/16/2010 Document Revised: 11/23/2011 [facility] Patient Information 2014 [place], [place].     Resource Guide (Spanish)   Problemas Dentales  Pacientes con Medicaid    :                                                               [place] [address] [place], [place], [place]                                             Phone:  [phone]               Phone:  [phone] Si usted no puede pagar o no tiene seguro mdico/dental pngase en contacto con: [place] para que le autoricen el uso de la Civil Service fast streamer para adultos.  Problemas con [place] Pngase en contacto con la clinica de dolores cronicos en el hospital de [place], [place].   Los pacientes deben ser [employer] por su medico de cabecera.  Si usted no tiene dinero para pagar sus medicinas Pngase en contacto con [employer]:  llame al "211" o Training and development officer.   Phone: [phone]  Si usted no tiene  mdico de [employer]      Phone: [phone]. Otras agencias que ofrecen cuidado mdico barato son:      [facility]  [place]  [phone]      [facility]  [phone]      Planned Parenthood  [facility]  [phone]  [place]  [phone] [facility]  [place]  [phone]  ([place] [phone])  Abuse/Neglect [facility] Child Abuse Hotline [place] Child Abuse Hotline [phone] (After Hours)  Emergency  [place] [phone]  [place] [facility] [place] [phone] [place] [phone]  MRSA Hotline #:   (332) [phone]  [facility] [place]       [place] Phone:  [phone]                            Phone: [phone]                Phone: [place] Phone:  [place] [phone] (After Hours)

## 2014-01-01 LAB — GC/CHLAMYDIA PROBE AMP
CT PROBE, AMP APTIMA: NEGATIVE
GC Probe RNA: NEGATIVE

## 2014-01-04 ENCOUNTER — Telehealth: Payer: Self-pay | Admitting: *Deleted

## 2014-01-04 NOTE — Telephone Encounter (Signed)
Opened in error

## 2014-01-10 ENCOUNTER — Encounter: Payer: Self-pay | Admitting: *Deleted

## 2014-02-16 ENCOUNTER — Encounter: Payer: PRIVATE HEALTH INSURANCE | Admitting: Obstetrics & Gynecology

## 2014-02-23 ENCOUNTER — Ambulatory Visit (INDEPENDENT_AMBULATORY_CARE_PROVIDER_SITE_OTHER): Payer: No Typology Code available for payment source | Admitting: Obstetrics & Gynecology

## 2014-02-23 ENCOUNTER — Encounter: Payer: Self-pay | Admitting: Obstetrics & Gynecology

## 2014-02-23 VITALS — BP 107/75 | HR 88 | Temp 97.8°F | Ht 61.0 in | Wt 171.1 lb

## 2014-02-23 DIAGNOSIS — N925 Other specified irregular menstruation: Secondary | ICD-10-CM

## 2014-02-23 DIAGNOSIS — N938 Other specified abnormal uterine and vaginal bleeding: Secondary | ICD-10-CM

## 2014-02-23 DIAGNOSIS — N949 Unspecified condition associated with female genital organs and menstrual cycle: Secondary | ICD-10-CM

## 2014-02-23 LAB — POCT PREGNANCY, URINE: Preg Test, Ur: NEGATIVE

## 2014-02-23 MED ORDER — MEDROXYPROGESTERONE ACETATE 150 MG/ML IM SUSP: 150.0000 mg | Freq: Once | INTRAMUSCULAR | Status: DC

## 2014-02-23 MED ORDER — MEDROXYPROGESTERONE ACETATE 104 MG/0.65ML ~~LOC~~ SUSP
104.0000 mg | Freq: Once | SUBCUTANEOUS | Status: AC
  Administered 2014-02-23: 104 mg via SUBCUTANEOUS

## 2014-02-23 NOTE — Progress Notes (Signed)
   Subjective:    Patient ID: Sarah Massey, female    DOB: December 26, 1970, 43 y.o.   MRN: [id number]  HPI  43 yo MH P6 here today for the complaint of pelvic pain, always on the left, for 7 years, IBU with some help, dyspareunia for 9 years.  She also says that her periods are heavy but her hemoglobin is 12.8.  An u/s recently showed a lining of 105mm and a 3.8 subserosal fibroid.   Review of Systems She uses condoms for contraception Last pap about 18 months ago Last mammogram about 5 years ago. CT and GC always negative    Objective:   Physical Exam        Assessment & Plan:  Preventative- schedule mammogram Fibroid and pelvic pain- trial of depo provera

## 2014-05-28 ENCOUNTER — Ambulatory Visit (INDEPENDENT_AMBULATORY_CARE_PROVIDER_SITE_OTHER): Payer: No Typology Code available for payment source | Admitting: *Deleted

## 2014-05-28 VITALS — BP 105/73 | HR 82 | Wt 171.5 lb

## 2014-05-28 DIAGNOSIS — N938 Other specified abnormal uterine and vaginal bleeding: Secondary | ICD-10-CM

## 2014-05-28 DIAGNOSIS — N949 Unspecified condition associated with female genital organs and menstrual cycle: Secondary | ICD-10-CM

## 2014-05-28 MED ORDER — MEDROXYPROGESTERONE ACETATE 104 MG/0.65ML ~~LOC~~ SUSP
104.0000 mg | Freq: Once | SUBCUTANEOUS | Status: AC
  Administered 2014-05-28: 104 mg via SUBCUTANEOUS

## 2014-05-28 NOTE — Progress Notes (Signed)
Depo Provera 104 mg SQ given.

## 2014-07-16 ENCOUNTER — Encounter: Payer: Self-pay | Admitting: Obstetrics & Gynecology

## 2014-08-20 ENCOUNTER — Ambulatory Visit (INDEPENDENT_AMBULATORY_CARE_PROVIDER_SITE_OTHER): Payer: No Typology Code available for payment source | Admitting: *Deleted

## 2014-08-20 VITALS — BP 127/62 | HR 84 | Ht 61.0 in

## 2014-08-20 DIAGNOSIS — N938 Other specified abnormal uterine and vaginal bleeding: Secondary | ICD-10-CM

## 2014-08-20 MED ORDER — MEDROXYPROGESTERONE ACETATE 104 MG/0.65ML ~~LOC~~ SUSP
104.0000 mg | Freq: Once | SUBCUTANEOUS | Status: AC
  Administered 2014-08-20: 104 mg via SUBCUTANEOUS

## 2014-11-12 ENCOUNTER — Ambulatory Visit: Payer: Self-pay

## 2015-07-15 ENCOUNTER — Other Ambulatory Visit (HOSPITAL_COMMUNITY): Payer: Self-pay | Admitting: Obstetrics & Gynecology

## 2015-07-15 DIAGNOSIS — Z1231 Encounter for screening mammogram for malignant neoplasm of breast: Secondary | ICD-10-CM

## 2015-07-17 ENCOUNTER — Ambulatory Visit
Admission: RE | Admit: 2015-07-17 | Discharge: 2015-07-17 | Disposition: A | Discharge status: Home / Self Care | Payer: No Typology Code available for payment source | Source: Ambulatory Visit | Attending: Obstetrics & Gynecology | Admitting: Obstetrics & Gynecology

## 2015-07-17 DIAGNOSIS — Z1231 Encounter for screening mammogram for malignant neoplasm of breast: Secondary | ICD-10-CM

## 2016-04-19 ENCOUNTER — Emergency Department (HOSPITAL_COMMUNITY)
Admission: EM | Admit: 2016-04-19 | Discharge: 2016-04-20 | Disposition: A | Discharge status: Home / Self Care | Payer: Self-pay | Attending: Emergency Medicine | Admitting: Emergency Medicine

## 2016-04-19 ENCOUNTER — Emergency Department (HOSPITAL_COMMUNITY): Payer: Self-pay

## 2016-04-19 ENCOUNTER — Encounter (HOSPITAL_COMMUNITY): Payer: Self-pay

## 2016-04-19 DIAGNOSIS — M791 Myalgia, unspecified site: Secondary | ICD-10-CM

## 2016-04-19 DIAGNOSIS — R1032 Left lower quadrant pain: Secondary | ICD-10-CM | POA: Insufficient documentation

## 2016-04-19 DIAGNOSIS — F329 Major depressive disorder, single episode, unspecified: Secondary | ICD-10-CM | POA: Insufficient documentation

## 2016-04-19 DIAGNOSIS — J45909 Unspecified asthma, uncomplicated: Secondary | ICD-10-CM | POA: Insufficient documentation

## 2016-04-19 LAB — CBC WITH DIFFERENTIAL/PLATELET
BASOS PCT: 0 %
Basophils Absolute: 0 10*3/uL (ref 0.0–0.1)
EOS ABS: 0.4 10*3/uL (ref 0.0–0.7)
EOS PCT: 5 %
HCT: 36.4 % (ref 36.0–46.0)
HEMOGLOBIN: 12.2 g/dL (ref 12.0–15.0)
Lymphocytes Relative: 43 %
Lymphs Abs: 3.9 10*3/uL (ref 0.7–4.0)
MCH: 28.2 pg (ref 26.0–34.0)
MCHC: 33.5 g/dL (ref 30.0–36.0)
MCV: 84.1 fL (ref 78.0–100.0)
MONOS PCT: 6 %
Monocytes Absolute: 0.5 10*3/uL (ref 0.1–1.0)
NEUTROS PCT: 46 %
Neutro Abs: 4.2 10*3/uL (ref 1.7–7.7)
PLATELETS: 258 10*3/uL (ref 150–400)
RBC: 4.33 MIL/uL (ref 3.87–5.11)
RDW: 13.8 % (ref 11.5–15.5)
WBC: 9 10*3/uL (ref 4.0–10.5)

## 2016-04-19 LAB — BASIC METABOLIC PANEL
Anion gap: 6 (ref 5–15)
BUN: 9 mg/dL (ref 6–20)
CALCIUM: 8.9 mg/dL (ref 8.9–10.3)
CO2: 27 mmol/L (ref 22–32)
CREATININE: 0.66 mg/dL (ref 0.44–1.00)
Chloride: 105 mmol/L (ref 101–111)
GFR calc Af Amer: >60 mL/min (ref >60)
GFR calc non Af Amer: >60 mL/min (ref >60)
Glucose, Bld: 105 mg/dL — ABNORMAL HIGH (ref 65–99)
Potassium: 3.8 mmol/L (ref 3.5–5.1)
SODIUM: 138 mmol/L (ref 135–145)

## 2016-04-19 LAB — D-DIMER, QUANTITATIVE (NOT AT ARMC): D DIMER QUANT: 0.59 ug{FEU}/mL — AB (ref 0.00–0.50)

## 2016-04-19 LAB — TROPONIN I

## 2016-04-19 MED ORDER — HYDROCODONE-ACETAMINOPHEN 5-325 MG PO TABS
1.0000 | ORAL_TABLET | Freq: Once | ORAL | Status: AC
  Administered 2016-04-19: 1 via ORAL
  Filled 2016-04-19: qty 1

## 2016-04-19 MED ORDER — ONDANSETRON 4 MG PO TBDP
8.0000 mg | ORAL_TABLET | Freq: Once | ORAL | Status: AC
  Administered 2016-04-19: 8 mg via ORAL
  Filled 2016-04-19: qty 2

## 2016-04-19 NOTE — ED Notes (Signed)
Eulis Foster MD at bedside using interpreter ipad for history and physical exam

## 2016-04-19 NOTE — ED Triage Notes (Signed)
Pt states sudden onset of neck and back pain at 2 am; Pt report weakness in bilateral upper extremity as well. Pt denies any injury or confusion; Pt has good grips bilaterally; Pt speaks spanish; Pt A&ox 4 on arrival.

## 2016-04-19 NOTE — ED Provider Notes (Signed)
[facility] DEPT Provider Note   CSN: [id number] Arrival date & time: 04/19/16  2044  First Provider Contact:  First MD Initiated Contact with Patient 04/19/16 2213        History   Chief Complaint Chief Complaint  Patient presents with  . Back Pain  . Neck Pain    HPI Sarah Massey is a 45 y.o. female.  She presents for evaluation of generalized pain, which started yesterday and is persistent, keeping her awake at night. She has pain in both shoulders. both upper and lower back, upper chest, her left lower abdomen, her arms and legs bilaterally. She denies fever, chills, productive cough, vomiting, diarrhea, dysuria or urinary frequency. She has mild nausea. She states that she has a left sided uterine fibroid which is chronic. She is currently on her menstrual cycle. She denies use of cocaine, alcohol or tobacco. There are no other known modifying factors. She was interviewed using a Optometrist, by video telemetry.  HPI  Past Medical History:  Diagnosis Date  . Asthma   . Asthma 01/25/2012  . Depression    pt is depressed about her current pregnancy  . Fibroid   . Gestational diabetes    previous pregnancy  . Obesity   . PONV (postoperative nausea and vomiting)   . Urinary tract infection     Patient Active Problem List   Diagnosis Date Noted  . Asthma 01/25/2012  . [place] multipara 01/07/2012  . Fibroid uterus 01/07/2012  . Language barrier 01/07/2012  . Exposure to STD 01/07/2012  . H/O premature delivery 12/10/2011  . H/O cesarean section 12/10/2011  . AMA (advanced maternal age) multigravida 35+ 12/08/2011    Past Surgical History:  Procedure Laterality Date  . CESAREAN SECTION  2005  . DILATION AND CURETTAGE OF UTERUS      OB History    Gravida Para Term Preterm AB Living   9 6 4 2 3 6    SAB TAB Ectopic Multiple Live Births   3 0 0 0 6       Home Medications    Prior to Admission medications   Medication Sig Start Date End Date Taking?  Authorizing Provider  albuterol (PROVENTIL HFA;VENTOLIN HFA) 108 (90 BASE) MCG/ACT inhaler Inhale 1-2 puffs into the lungs every 6 (six) hours as needed for wheezing or shortness of breath.    Historical Provider, MD  cetirizine (ZYRTEC) 10 MG tablet Take 10 mg by mouth daily.    Historical Provider, MD  HYDROcodone-acetaminophen (NORCO/VICODIN) 5-325 MG per tablet Take 1-2 tablets by mouth every 4 (four) hours as needed. 12/31/13   Dorie Rank, MD  naproxen (NAPROSYN) 500 MG tablet Take 1 tablet (500 mg total) by mouth 2 (two) times daily. 12/31/13   Dorie Rank, MD    Family History Family History  Problem Relation Age of Onset  . Hypertension Mother   . Fibroids Sister   . Anesthesia problems Neg Hx     Social History Social History  Substance Use Topics  . Smoking status: Never Smoker  . Smokeless tobacco: Never Used  . Alcohol use No     Allergies   Review of patient's allergies indicates no known allergies.   Review of Systems Review of Systems  All other systems reviewed and are negative.    Physical Exam Updated Vital Signs BP 121/78   Pulse 67   Temp 98.3 F (36.8 C) (Oral)   Resp 16   LMP 04/19/2016   SpO2 98%  Physical Exam  Constitutional: She is oriented to person, place, and time. She appears well-developed and well-nourished.  HENT:  Head: Normocephalic and atraumatic.  Eyes: Conjunctivae and EOM are normal. Pupils are equal, round, and reactive to light.  Neck: Normal range of motion and phonation normal. Neck supple.  Cardiovascular: Normal rate and regular rhythm.   Pulmonary/Chest: Effort normal and breath sounds normal. She exhibits no tenderness.  Abdominal: Soft. She exhibits no distension. There is tenderness (Left lower quadrant, mild). There is no guarding.  Musculoskeletal: Normal range of motion. She exhibits no edema or tenderness.  Neurological: She is alert and oriented to person, place, and time. She exhibits normal muscle tone.  Skin:  Skin is warm and dry.  Psychiatric: She has a normal mood and affect. Her behavior is normal. Judgment and thought content normal.  Nursing note and vitals reviewed.    ED Treatments / Results  Labs (all labs ordered are listed, but only abnormal results are displayed) Labs Reviewed  BASIC METABOLIC PANEL - Abnormal; Notable for the following:       Result Value   Glucose, Bld 105 (*)    All other components within normal limits  D-DIMER, QUANTITATIVE (NOT AT [facility]) - Abnormal; Notable for the following:    D-Dimer, Quant 0.59 (*)    All other components within normal limits  CBC WITH DIFFERENTIAL/PLATELET  TROPONIN I  URINALYSIS, ROUTINE W REFLEX MICROSCOPIC (NOT AT [facility])  PREGNANCY, URINE    EKG  EKG Interpretation  Date/Time:  Sunday April 19 2016 22:47:37 EDT Ventricular Rate:  72 PR Interval:    QRS Duration: 88 QT Interval:  387 QTC Calculation: 424 R Axis:   16 Text Interpretation:  Sinus rhythm No old tracing to compare Confirmed by [facility]  MD, Jermesha Sottile [phone]) on 04/19/2016 11:07:10 PM       Radiology Dg Chest 2 View  Result Date: 04/19/2016 CLINICAL DATA:  Acute onset of upper back pain and neck pain. Initial encounter. EXAM: CHEST  2 VIEW COMPARISON:  Chest radiograph performed 10/30/2009 FINDINGS: The lungs are well-aerated and clear. There is no evidence of focal opacification, pleural effusion or pneumothorax. The heart is normal in size; the mediastinal contour is within normal limits. No acute osseous abnormalities are seen. IMPRESSION: No acute cardiopulmonary process seen. Electronically Signed   By: Garald Balding M.D.   On: 04/19/2016 23:57    Procedures Procedures (including critical care time)  Medications Ordered in ED Medications  ondansetron (ZOFRAN-ODT) disintegrating tablet 8 mg (8 mg Oral Given 04/19/16 2331)  HYDROcodone-acetaminophen (NORCO/VICODIN) 5-325 MG per tablet 1 tablet (1 tablet Oral Given 04/19/16 2330)     Initial Impression /  Assessment and Plan / ED Course  I have reviewed the triage vital signs and the nursing notes.  Pertinent labs & imaging results that were available during my care of the patient were reviewed by me and considered in my medical decision making (see chart for details).  Clinical Course    Medications  ondansetron (ZOFRAN-ODT) disintegrating tablet 8 mg (8 mg Oral Given 04/19/16 2331)  HYDROcodone-acetaminophen (NORCO/VICODIN) 5-325 MG per tablet 1 tablet (1 tablet Oral Given 04/19/16 2330)    Patient Vitals for the past 24 hrs:  BP Temp Temp src Pulse Resp SpO2  04/19/16 2330 121/78 - - 67 16 98 %  04/19/16 2300 124/82 - - 69 18 98 %  04/19/16 2245 125/79 - - 67 - 99 %  04/19/16 2230 124/74 - - 62 - 98 %  04/19/16 2101 119/98 98.3 F (36.8 C) Oral 73 16 98 %    12:03 AM Reevaluation with update and discussion. After initial assessment and treatment, an updated evaluation reveals No change in clinical status. D-dimer mildly elevated, so CT chest for angiogram ordered, to rule out PE. Rexine Gowens L    Final Clinical Impressions(s) / ED Diagnoses   Final diagnoses:  Myalgia    Nonspecific pain. Screening evaluation indicates elevated d-dimer. Doubt ACS or pneumonia.  Nursing Notes Reviewed/ Care Coordinated, and agree without changes. Applicable Imaging Reviewed.  Interpretation of Laboratory Data incorporated into ED treatment  Plan as per oncoming provider team, to evaluate after CT imaging to rule out pulmonary embolus.   New Prescriptions New Prescriptions   No medications on file     Daleen Bo, MD 04/20/16 0005

## 2016-04-19 NOTE — ED Triage Notes (Signed)
Patient here with numerous complaints.  Was seen at Urgent Care today for Cocaine intoxication. Today has numerous complaints and unable to keep on track.  States her veins are popping out, states she cannot even cry, blew her nose and stated she felt her ear pop.  Laying on the chair stating she feels better when she lays down.

## 2016-04-20 ENCOUNTER — Encounter (HOSPITAL_COMMUNITY): Payer: Self-pay | Admitting: Radiology

## 2016-04-20 ENCOUNTER — Emergency Department (HOSPITAL_COMMUNITY): Payer: Self-pay

## 2016-04-20 LAB — URINALYSIS, ROUTINE W REFLEX MICROSCOPIC
Bilirubin Urine: NEGATIVE
GLUCOSE, UA: NEGATIVE mg/dL
Ketones, ur: 15 mg/dL — AB
Leukocytes, UA: NEGATIVE
Nitrite: NEGATIVE
Protein, ur: NEGATIVE mg/dL
SPECIFIC GRAVITY, URINE: 1.024 (ref 1.005–1.030)
pH: 7 (ref 5.0–8.0)

## 2016-04-20 LAB — URINE MICROSCOPIC-ADD ON

## 2016-04-20 LAB — PREGNANCY, URINE: Preg Test, Ur: NEGATIVE

## 2016-04-20 MED ORDER — IOPAMIDOL (ISOVUE-370) INJECTION 76%
INTRAVENOUS | Status: AC
  Administered 2016-04-20: 75 mL
  Filled 2016-04-20: qty 100

## 2016-04-20 MED ORDER — NAPROXEN 500 MG PO TABS: 500.0000 mg | ORAL_TABLET | Freq: Two times a day (BID) | ORAL | 0 refills | Status: DC

## 2016-04-20 NOTE — ED Notes (Signed)
Patient transported to CT 

## 2016-04-20 NOTE — ED Notes (Signed)
Pt departed in NAD, refused use of wheelchair.  

## 2016-04-20 NOTE — ED Provider Notes (Signed)
CT angio normal DC home with Rx for Naprosyn   Junius Creamer, NP 04/20/16 0202    Junius Creamer, NP 04/20/16 [id number]    Daleen Bo, MD 04/20/16 [phone]

## 2016-04-20 NOTE — Discharge Instructions (Signed)
Take the Naprosyn on a regular basis for the next several days than as needed

## 2016-07-10 ENCOUNTER — Other Ambulatory Visit: Payer: Self-pay | Admitting: Obstetrics & Gynecology

## 2016-07-10 DIAGNOSIS — Z1231 Encounter for screening mammogram for malignant neoplasm of breast: Secondary | ICD-10-CM

## 2016-07-24 ENCOUNTER — Ambulatory Visit
Admission: RE | Admit: 2016-07-24 | Discharge: 2016-07-24 | Disposition: A | Discharge status: Home / Self Care | Payer: No Typology Code available for payment source | Source: Ambulatory Visit | Attending: Obstetrics & Gynecology | Admitting: Obstetrics & Gynecology

## 2016-07-24 DIAGNOSIS — Z1231 Encounter for screening mammogram for malignant neoplasm of breast: Secondary | ICD-10-CM

## 2016-08-21 LAB — GLUCOSE, POCT (MANUAL RESULT ENTRY): POC GLUCOSE: 118 mg/dL — AB (ref 70–99)

## 2017-06-08 ENCOUNTER — Ambulatory Visit (INDEPENDENT_AMBULATORY_CARE_PROVIDER_SITE_OTHER): Payer: Self-pay | Admitting: Internal Medicine

## 2017-06-08 ENCOUNTER — Encounter: Payer: Self-pay | Admitting: Internal Medicine

## 2017-06-08 VITALS — BP 130/80 | HR 74 | Resp 12 | Ht 60.0 in | Wt 160.0 lb

## 2017-06-08 DIAGNOSIS — R1032 Left lower quadrant pain: Secondary | ICD-10-CM

## 2017-06-08 DIAGNOSIS — R300 Dysuria: Secondary | ICD-10-CM

## 2017-06-08 DIAGNOSIS — J45909 Unspecified asthma, uncomplicated: Secondary | ICD-10-CM

## 2017-06-08 LAB — POCT URINALYSIS DIPSTICK
Bilirubin, UA: NEGATIVE
Glucose, UA: NEGATIVE
Ketones, UA: NEGATIVE
LEUKOCYTES UA: NEGATIVE
NITRITE UA: NEGATIVE
Protein, UA: 30
RBC UA: 50
Spec Grav, UA: 1.01 (ref 1.010–1.025)
UROBILINOGEN UA: 0.2 U/dL

## 2017-06-08 MED ORDER — NAPROXEN 500 MG PO TABS: 500.0000 mg | ORAL_TABLET | Freq: Two times a day (BID) | ORAL | 4 refills | Status: DC

## 2017-06-08 NOTE — Progress Notes (Signed)
   Subjective:    Patient ID: Sarah Massey, female    DOB: [birth year], 46 y.o.   MRN: [id number]  HPI   Here to establish  Sarah Massey, friend's daughter, here to interpret  1.  Abdominal pain:  Points to her LLQ.  Feels like carrying something heavy in her abdomen there--like something causing pressure there.   Prior to 6 months ago, only with period.  In past 2 months, has pain every day. Hurts more to sit, can resolve if lying down or is still.  When bends over to left, hurts more. Stopped regular periods lasting 7 days 6 months ago, but still with one day of small amount of flow.  Has a bad smell--and is dark green/black.  Pain is worse when having this discharge or flow.   No fevers associated.    Also, for past 3 weeks has to run to bathroom to urinate every few minutes--small amount.  Not clear if dysuria, does have pain with urination in LLQ.  States she was told she has a uterine fibroid and bilateral ovarian cysts. Ultrasound in 12/31/2013 showed 3.8 cm subserosal fibroid.  Not evaluated elswhere for this Has tried Tylenol with some improvement.  Has taken ibuprofen, but makes her sleepy States has not tried Naproxen that she currently has.    No Known Allergies          Review of Systems     Objective:   Physical Exam  NAD Lungs:  Scattered expiratory wheeze, good air movement, no crackles CV:  RRR without murmur or rub, radial and DP pulses normal and equal Abd:  S, + BS, No HSM.  Tender in LLQ and suprapubic areas.  Possibly some fullness in LLQ, but mild guarding.   GU:  Normal external female genitalia.  Minimal clear vaginal discharge.  No odor. No CMT.  No right adnexal mass or tenderness.  Unable to adequately feel fundus of uterus.  Firm, somewhat tender left adnexal mass.        Assessment & Plan:  1.  LLQ pain and mass on bimanual exam:  CBC, CMP, needs pelvic ultrasound quickly.  Not sure if this is a fibroid on left aspect of uterus or if this is  her ovary.   Encouraged her to consider getting ultrasound at [facility] and subsequently applying for financial assistance if unable to get quickly through [facility] orange card.  She is resistant. Naproxen for pain.  2.  Dysuria:  UA and urine culture if abnormal.  3.  Asthma:  Patient shares this diagnosis during exam.  She states she does have and Advair inhaler and rescue inhaler, but was too busy to use today.  Obtained through clinic I am not familiar with.  Will call [facility] in the morning to clarify her dosing and where she was being seen previously.

## 2017-06-09 ENCOUNTER — Telehealth: Payer: Self-pay | Admitting: Licensed Clinical Social Worker

## 2017-06-09 LAB — CBC WITH DIFFERENTIAL/PLATELET
BASOS ABS: 0 10*3/uL (ref 0.0–0.2)
BASOS: 0 %
EOS (ABSOLUTE): 0.3 10*3/uL (ref 0.0–0.4)
Eos: 3 %
HEMATOCRIT: 37.1 % (ref 34.0–46.6)
HEMOGLOBIN: 12.6 g/dL (ref 11.1–15.9)
IMMATURE GRANS (ABS): 0 10*3/uL (ref 0.0–0.1)
Immature Granulocytes: 0 %
LYMPHS: 47 %
Lymphocytes Absolute: 4.2 10*3/uL — ABNORMAL HIGH (ref 0.7–3.1)
MCH: 29.2 pg (ref 26.6–33.0)
MCHC: 34 g/dL (ref 31.5–35.7)
MCV: 86 fL (ref 79–97)
MONOS ABS: 0.5 10*3/uL (ref 0.1–0.9)
Monocytes: 6 %
NEUTROS ABS: 4 10*3/uL (ref 1.4–7.0)
NEUTROS PCT: 44 %
Platelets: 269 10*3/uL (ref 150–379)
RBC: 4.31 x10E6/uL (ref 3.77–5.28)
RDW: 15.1 % (ref 12.3–15.4)
WBC: 9.1 10*3/uL (ref 3.4–10.8)

## 2017-06-09 LAB — COMPREHENSIVE METABOLIC PANEL
ALBUMIN: 4.6 g/dL (ref 3.5–5.5)
ALT: 39 IU/L — ABNORMAL HIGH (ref 0–32)
AST: 29 IU/L (ref 0–40)
Albumin/Globulin Ratio: 1.5 (ref 1.2–2.2)
Alkaline Phosphatase: 80 IU/L (ref 39–117)
BUN / CREAT RATIO: 19 (ref 9–23)
BUN: 10 mg/dL (ref 6–24)
CO2: 24 mmol/L (ref 20–29)
CREATININE: 0.52 mg/dL — AB (ref 0.57–1.00)
Calcium: 9.4 mg/dL (ref 8.7–10.2)
Chloride: 98 mmol/L (ref 96–106)
GFR calc non Af Amer: 115 mL/min/{1.73_m2} (ref >59)
GFR, EST AFRICAN AMERICAN: 133 mL/min/{1.73_m2} (ref >59)
GLOBULIN, TOTAL: 3 g/dL (ref 1.5–4.5)
GLUCOSE: 97 mg/dL (ref 65–99)
Potassium: 4.2 mmol/L (ref 3.5–5.2)
SODIUM: 140 mmol/L (ref 134–144)
TOTAL PROTEIN: 7.6 g/dL (ref 6.0–8.5)

## 2017-06-09 NOTE — Progress Notes (Signed)
Pat printed referral and will process

## 2017-06-09 NOTE — Progress Notes (Signed)
Fraser Din has received referral and has faxed and spoke with Sande Rives.  Referral will be processed by [facility] on June 14, 2017.

## 2017-06-09 NOTE — Telephone Encounter (Signed)
Social work Theatre manager spoke with Mrs. Sarah Massey to inform her about the counseling services we provide. Pt advised that she will be coming again to get the results of her blood tests and she will advise the nurse if she considers counseling in the future.

## 2017-06-10 LAB — URINE CULTURE

## 2017-06-10 LAB — GC/CHLAMYDIA PROBE AMP
Chlamydia trachomatis, NAA: NEGATIVE
NEISSERIA GONORRHOEAE BY PCR: NEGATIVE

## 2017-06-16 MED ORDER — AMOXICILLIN 500 MG PO CAPS: 500.0000 mg | ORAL_CAPSULE | Freq: Three times a day (TID) | ORAL | 0 refills | Status: DC

## 2017-06-16 NOTE — Addendum Note (Signed)
Addended by: Marcelino Duster on: 06/16/2017 08:36 AM   Modules accepted: Orders

## 2017-06-17 ENCOUNTER — Telehealth: Payer: Self-pay | Admitting: Internal Medicine

## 2017-06-17 NOTE — Telephone Encounter (Signed)
Patient was called to collect more information regarding the pain and was given instructions to follow like: - Drink plenty of water. - Take antibiotics as prescribed/ try to not miss any dose. - Call clinic a few days after start medication if symptoms get worse or fail to improve.  Patient agreed

## 2017-06-17 NOTE — Telephone Encounter (Signed)
Patient called statting has really bad pain in the  Lower pelvic area since 5:00 am. Patient stated pain gets worse as time goes. Patient also stated has nausea because of the pain. Patient took  A Motrin 800 mg.   Patient will like to be seen  today or can have any prescription for the pain.  To Cherice to further action

## 2017-06-17 NOTE — Telephone Encounter (Signed)
Antony Madura called patient to let her know she needs to pick antibiotics and start them as soon as possible and explained the UI maybe is the reason for  the pain.  Patient was explained as well about US Pelvis Complete Referral  Fraser Din spoke with Sande Rives regarding referral for this patient (US Pelvis Complete and Transvaginal Non-Ob.  Sande Rives informed Fraser Din that she has scheduled all for this month.  She will hold fax/referral for next month (October, 2018)  This referral does not have to be re-faxed in October referral/faxes.).  Antony Madura informed patient about referral thru [facility] and explained about Financial Assistance process; patient refused referral thru [facility] and stated she will wait for next availability in November thru [facility].

## 2017-06-17 NOTE — Telephone Encounter (Signed)
Where is her pain located is it on the left or right side?. How long has she had this pain and what does it feel like.

## 2017-07-29 ENCOUNTER — Inpatient Hospital Stay: Admission: RE | Admit: 2017-07-29 | Payer: No Typology Code available for payment source | Source: Ambulatory Visit

## 2017-07-29 ENCOUNTER — Other Ambulatory Visit: Payer: No Typology Code available for payment source

## 2017-07-29 ENCOUNTER — Ambulatory Visit
Admission: RE | Admit: 2017-07-29 | Discharge: 2017-07-29 | Disposition: A | Discharge status: Home / Self Care | Payer: No Typology Code available for payment source | Source: Ambulatory Visit | Attending: Internal Medicine | Admitting: Internal Medicine

## 2017-07-29 DIAGNOSIS — R1032 Left lower quadrant pain: Secondary | ICD-10-CM

## 2017-08-12 ENCOUNTER — Encounter: Payer: Self-pay | Admitting: Internal Medicine

## 2017-08-12 ENCOUNTER — Ambulatory Visit: Payer: Self-pay | Admitting: Internal Medicine

## 2017-08-12 VITALS — BP 122/80 | HR 76 | Resp 12 | Ht 60.0 in | Wt 161.0 lb

## 2017-08-12 DIAGNOSIS — R350 Frequency of micturition: Secondary | ICD-10-CM

## 2017-08-12 DIAGNOSIS — N3001 Acute cystitis with hematuria: Secondary | ICD-10-CM

## 2017-08-12 DIAGNOSIS — D252 Subserosal leiomyoma of uterus: Secondary | ICD-10-CM

## 2017-08-12 LAB — POCT URINALYSIS DIPSTICK
BILIRUBIN UA: NEGATIVE
GLUCOSE UA: NEGATIVE
Ketones, UA: NEGATIVE
LEUKOCYTES UA: NEGATIVE
NITRITE UA: NEGATIVE
PH UA: 6.5 (ref 5.0–8.0)
PROTEIN UA: NEGATIVE
Spec Grav, UA: <=1.005 — AB (ref 1.010–1.025)
Urobilinogen, UA: 0.2 E.U./dL

## 2017-08-12 MED ORDER — AMOXICILLIN 500 MG PO CAPS: 500.0000 mg | ORAL_CAPSULE | Freq: Three times a day (TID) | ORAL | 0 refills | Status: DC

## 2017-08-12 NOTE — Progress Notes (Signed)
   Subjective:    Patient ID: Sarah Massey, female    DOB: January 02, 1971, 46 y.o.   MRN: [id number]  HPI   1.  LLQ pain:  For some reason was sent to [facility] and subsequently to [facility] on the 12 and 15th of November for the same study.  Both times, she states, they took her orange card.  Not clear how that occurred.  Her ultrasound showed no concerning findings.  She continues to have the 3+ cm subserosal uterine fibroid, which is likely what was palpated on pelvic exam. The left pelvic pain has since resolved.  She estimates the pain was resolved by the second day of being treated with Amoxicillin for the Group B strep that resulted from urine culture at the time.   2.  Dysuria and urinary frequency:  She states this did go away with Amoxicillin.  3 days after finishing the Amoxicillin, these symptoms recurred.  These symptoms continue. She is having a hard time holding her urine and has decreased her water intake because of this. Having nocturia 5 times.  Normally has up to 3 times at bedtime No fevers or flank tenderness. She is sexually active and states she urinates after intercourse every time.   Not clear why she did not call back to notify the clinic when symptoms recurred.   She states she did, but only able to find her phone call from before treatment.  3.  Asthma:  Did not bring meds again today so I can clarify.  Call into [facility] to confirm her meds  Current Meds  Medication Sig  . albuterol (PROVENTIL HFA;VENTOLIN HFA) 108 (90 BASE) MCG/ACT inhaler Inhale 1-2 puffs into the lungs every 6 (six) hours as needed for wheezing or shortness of breath.  . cetirizine (ZYRTEC) 10 MG tablet Take 10 mg by mouth daily.    No Known Allergies   Review of Systems     Objective:   Physical Exam   NAD LUngs:  CTA CV:  RRR without murmur or rub, radial pulses normal and equal Abd:  S, + BS, mild tenderness in LLQ/suprapubic area.  No rebound or peritoneal signs.  No flank  tenderness.        Assessment & Plan:  1.  LLQ pain with mass on pelvic exam:  Pain likely was due to Group B Strep cystitis.  Clinically, with a recurrent or partially treated cystitis.   Restart Amoxicillin 500 mg 3 times daily for 10 day course this time.  Sending urine for another culture and will change abx if new findings. Discussed to notify clinic in future if she has recurrent symptoms.  Checking on what happened that she underwent 2 ultrasound examinations.  2.  Uterine fibroid, unchanged: this is likely what I palpated on pelvic exam.  No left adnexal abnormality on ultrasound.  3.  By the way:  Having a lot of itching on chest.  Discussed we would address this on follow up in 4 weeks.

## 2017-08-12 NOTE — Addendum Note (Signed)
Addended by: Serafina Mitchell on: 08/12/2017 11:49 AM   Modules accepted: Orders

## 2017-08-14 LAB — URINE CULTURE

## 2017-08-17 ENCOUNTER — Other Ambulatory Visit: Payer: Self-pay

## 2017-08-17 DIAGNOSIS — N3001 Acute cystitis with hematuria: Secondary | ICD-10-CM

## 2017-08-17 LAB — POCT URINALYSIS DIPSTICK
BILIRUBIN UA: NEGATIVE
Glucose, UA: NEGATIVE
Ketones, UA: NEGATIVE
Leukocytes, UA: NEGATIVE
NITRITE UA: NEGATIVE
Protein, UA: NEGATIVE
Spec Grav, UA: 1.01 (ref 1.010–1.025)
Urobilinogen, UA: 0.2 E.U./dL
pH, UA: 6.5 (ref 5.0–8.0)

## 2017-08-17 MED ORDER — MOXIFLOXACIN HCL 400 MG PO TABS: 400.0000 mg | ORAL_TABLET | Freq: Every day | ORAL | 0 refills | Status: DC

## 2017-08-17 NOTE — Progress Notes (Unsigned)
Have called [facility]:  They will run susceptibilities on her Group B strep portion.  The other portion was low count mixed flora.  Her previous urine culture grew Group B strep as well, she responded and then recurred with symptoms. She is now having intermittent nausea and vomiting as well as flank pain.  Shivering from time to time.  No definite fever. Switch to Moxafloxacin 400 mg daily for 10 days.

## 2017-08-30 ENCOUNTER — Other Ambulatory Visit: Payer: Self-pay

## 2017-08-30 DIAGNOSIS — N3001 Acute cystitis with hematuria: Secondary | ICD-10-CM

## 2017-08-30 LAB — POCT URINALYSIS DIPSTICK
BILIRUBIN UA: NEGATIVE
GLUCOSE UA: NEGATIVE
KETONES UA: NEGATIVE
NITRITE UA: NEGATIVE
PROTEIN UA: NEGATIVE
Urobilinogen, UA: 0.2 E.U./dL
pH, UA: 5 (ref 5.0–8.0)

## 2017-08-30 MED ORDER — MOXIFLOXACIN HCL 400 MG PO TABS: 400.0000 mg | ORAL_TABLET | Freq: Every day | ORAL | 0 refills | Status: DC

## 2017-09-01 LAB — URINE CULTURE

## 2017-09-03 ENCOUNTER — Other Ambulatory Visit: Payer: Self-pay | Admitting: Internal Medicine

## 2017-09-03 MED ORDER — NITROFURANTOIN MONOHYD MACRO 100 MG PO CAPS: 100.0000 mg | ORAL_CAPSULE | Freq: Two times a day (BID) | ORAL | 0 refills | Status: DC

## 2017-09-16 ENCOUNTER — Ambulatory Visit: Payer: Self-pay | Admitting: Internal Medicine

## 2017-09-17 LAB — SUSCEPTIBILITY, HAEMOPHILUS

## 2017-09-17 LAB — SPECIMEN STATUS REPORT

## 2017-09-20 ENCOUNTER — Other Ambulatory Visit: Payer: Self-pay

## 2017-09-20 DIAGNOSIS — N3001 Acute cystitis with hematuria: Secondary | ICD-10-CM

## 2017-09-20 LAB — POCT URINALYSIS DIPSTICK
Bilirubin, UA: NEGATIVE
Glucose, UA: NEGATIVE
Ketones, UA: NEGATIVE
NITRITE UA: NEGATIVE
PROTEIN UA: NEGATIVE
Spec Grav, UA: 1.01 (ref 1.010–1.025)
Urobilinogen, UA: 0.2 E.U./dL
pH, UA: 5 (ref 5.0–8.0)

## 2017-09-20 NOTE — Progress Notes (Signed)
Patient came in for a repeat UA. States she is still having frequency and no other symptoms. Patient states she completed the entire dose of Macrobid. Urine still has some blood and trace of leuks will send out for culture.

## 2017-09-23 ENCOUNTER — Encounter: Payer: Self-pay | Admitting: Internal Medicine

## 2017-09-23 ENCOUNTER — Ambulatory Visit: Payer: Self-pay | Admitting: Internal Medicine

## 2017-09-23 VITALS — BP 122/80 | HR 76 | Resp 12 | Ht 60.0 in | Wt 162.0 lb

## 2017-09-23 DIAGNOSIS — B3731 Acute candidiasis of vulva and vagina: Secondary | ICD-10-CM

## 2017-09-23 DIAGNOSIS — B373 Candidiasis of vulva and vagina: Secondary | ICD-10-CM

## 2017-09-23 DIAGNOSIS — N390 Urinary tract infection, site not specified: Secondary | ICD-10-CM

## 2017-09-23 LAB — URINE CULTURE

## 2017-09-23 LAB — POCT WET PREP WITH KOH
Clue Cells Wet Prep HPF POC: NEGATIVE
KOH Prep POC: NEGATIVE
Trichomonas, UA: NEGATIVE

## 2017-09-23 MED ORDER — NITROFURANTOIN MONOHYD MACRO 100 MG PO CAPS
100.0000 mg | ORAL_CAPSULE | Freq: Two times a day (BID) | ORAL | 0 refills | Status: DC
Start: 2017-09-23 — End: 2018-03-10

## 2017-09-23 MED ORDER — FLUCONAZOLE 150 MG PO TABS: ORAL_TABLET | ORAL | 0 refills | Status: DC

## 2017-09-23 NOTE — Progress Notes (Signed)
   Subjective:    Patient ID: Sarah Massey, female    DOB: 1971/05/10, 47 y.o.   MRN: [id number]  HPI   Recurrent UTI:  Finished Macrobid 100 mg twice daily for 10 days on December 30th or 31st.  This was for Enterococcus in last culture.  She states she felt much better toward end of treatment, but still with very mild nocturia.  Prior to treatment she was having 5 episodes of nocturia nightly and at end of treatment, just 2 times nightly.  She did not have dysuria any longer. She did not call to let us know she was still a bit symptomatic as she was in [place].  Previously, she had had urine cultures x 2 with Group B strep.  She is now having white discharge after the most recent round of antibiotics.  She also has a bad odor and itching, burning. Her husband has used condoms for the past 13 years--the same brand. She urinates after sex each time.  They generally have sex 4 times weekly.   Her urine culture from 09/20/2017 has grown staph epidermidis sensitive to Macrobid and enterooccus sensitive to [place].  She is not passing fecal material nor flatus via her urine or vaginal area.  Current Meds  Medication Sig  . albuterol (PROVENTIL HFA;VENTOLIN HFA) 108 (90 BASE) MCG/ACT inhaler Inhale 1-2 puffs into the lungs every 6 (six) hours as needed for wheezing or shortness of breath.  . cetirizine (ZYRTEC) 10 MG tablet Take 10 mg by mouth daily.    No Known Allergies     Review of Systems     Objective:   Physical Exam   GU:  NT over suprapubic area.  Vulvar area, including surrounding external urethra quite inflamed with white discharge adherent to external mucosa, but not so erythematous inside vaginal canal.  Scant old dark blood in proximal vaginal.  No odor noted.   Wet prep with RBCs and budding yeast.          Assessment & Plan:  1.  Recurrent UTI:  First two episodes with Group B strep, 3rd with enterococcus, now with Staph epidermidis and Enterococcus, both  sensitive to Macrobid. Treat with Macrobid 100 mg twice daily for 14 days.  This time she is to return before she completes course for repeat UA and assessment.  2.  Yeast Vaginitis:  Fluconazole 150 mg daily for 2 days.  If symptoms do not resolve, to take the 3rd dose.  If resolved, save third tab for the day after she finishes the [place].

## 2017-10-07 ENCOUNTER — Ambulatory Visit: Payer: Self-pay | Admitting: Internal Medicine

## 2017-10-07 ENCOUNTER — Encounter: Payer: Self-pay | Admitting: Internal Medicine

## 2017-10-07 VITALS — BP 110/78 | HR 76 | Resp 14 | Ht 60.0 in | Wt 163.0 lb

## 2017-10-07 DIAGNOSIS — B373 Candidiasis of vulva and vagina: Secondary | ICD-10-CM

## 2017-10-07 DIAGNOSIS — N390 Urinary tract infection, site not specified: Secondary | ICD-10-CM

## 2017-10-07 DIAGNOSIS — B3731 Acute candidiasis of vulva and vagina: Secondary | ICD-10-CM

## 2017-10-07 LAB — POCT URINALYSIS DIPSTICK
BILIRUBIN UA: NEGATIVE
Glucose, UA: NEGATIVE
KETONES UA: NEGATIVE
Leukocytes, UA: NEGATIVE
Nitrite, UA: NEGATIVE
Protein, UA: 15
Spec Grav, UA: 1.015 (ref 1.010–1.025)
UROBILINOGEN UA: NEGATIVE U/dL — AB
pH, UA: 6.5 (ref 5.0–8.0)

## 2017-10-07 NOTE — Progress Notes (Signed)
   Subjective:    Patient ID: Sarah Massey, female    DOB: 1970-11-03, 47 y.o.   MRN: [id number]  HPI   Recurrent UTI:  Finished Macrobid for enterococcus and Staph epi grown from yet another urine culture.  Has had recurrent UTI since September.  Group B strep, enterococci and the 2 above with the last one in January.   Feels much better now, though does have a bit of urinary frequency.  Is drinking lots of water now.   Urinates after intercourse regularly. Her vaginal itching and discharge with yeast vaginitis has resolved.  She took the Fluconazole daily for 2 days at the beginning of this last course of Macrobid (10 day course).  Has another tab of Fluconazole should she have new symptoms. Blood sugar was fine in September.   UA today without glucose in urine.  Current Meds  Medication Sig  . albuterol (PROVENTIL HFA;VENTOLIN HFA) 108 (90 BASE) MCG/ACT inhaler Inhale 1-2 puffs into the lungs every 6 (six) hours as needed for wheezing or shortness of breath.  . cetirizine (ZYRTEC) 10 MG tablet Take 10 mg by mouth daily.    No Known Allergies    Review of Systems     Objective:   Physical Exam NAD Lungs:  CTA CV:  RRR without murmur or rub, radial pulses normal and equal Abd:  S, NT, No HSM or mass.  No flank or suprapubic tenderness.  + BS       Assessment & Plan:  1.  Recurrent UTI with different organisms and delay in follow up to clarify if clearing with each episode.   Finally seems to be clear of UTI at this point, however, will send Urine for culture to confirm.  2.  Vaginal yeast infection due to recurrent antibiotic use:  Resolved.  CPE in 4 months.

## 2017-10-07 NOTE — Patient Instructions (Signed)
Habla clinica si tiene problema con [place]

## 2017-10-09 LAB — URINE CULTURE

## 2017-11-20 DIAGNOSIS — N390 Urinary tract infection, site not specified: Secondary | ICD-10-CM | POA: Insufficient documentation

## 2018-02-03 ENCOUNTER — Encounter: Payer: Self-pay | Admitting: Internal Medicine

## 2018-02-03 ENCOUNTER — Ambulatory Visit: Payer: Self-pay | Admitting: Internal Medicine

## 2018-02-03 VITALS — BP 140/80 | HR 64 | Resp 12 | Ht 60.0 in | Wt 166.0 lb

## 2018-02-03 DIAGNOSIS — N898 Other specified noninflammatory disorders of vagina: Secondary | ICD-10-CM

## 2018-02-03 DIAGNOSIS — E010 Iodine-deficiency related diffuse (endemic) goiter: Secondary | ICD-10-CM

## 2018-02-03 DIAGNOSIS — R300 Dysuria: Secondary | ICD-10-CM

## 2018-02-03 DIAGNOSIS — Z1231 Encounter for screening mammogram for malignant neoplasm of breast: Secondary | ICD-10-CM

## 2018-02-03 DIAGNOSIS — Z124 Encounter for screening for malignant neoplasm of cervix: Secondary | ICD-10-CM

## 2018-02-03 DIAGNOSIS — Z0000 Encounter for general adult medical examination without abnormal findings: Secondary | ICD-10-CM

## 2018-02-03 DIAGNOSIS — Z1239 Encounter for other screening for malignant neoplasm of breast: Secondary | ICD-10-CM

## 2018-02-03 DIAGNOSIS — J45909 Unspecified asthma, uncomplicated: Secondary | ICD-10-CM | POA: Insufficient documentation

## 2018-02-03 LAB — POCT URINALYSIS DIPSTICK
Bilirubin, UA: NEGATIVE
Glucose, UA: NEGATIVE
KETONES UA: NEGATIVE
Leukocytes, UA: NEGATIVE
NITRITE UA: NEGATIVE
PROTEIN UA: NEGATIVE
Spec Grav, UA: <=1.005 — AB (ref 1.010–1.025)
Urobilinogen, UA: 0.2 E.U./dL
pH, UA: 6.5 (ref 5.0–8.0)

## 2018-02-03 LAB — POCT WET PREP WITH KOH
KOH Prep POC: NEGATIVE
RBC WET PREP PER HPF POC: NEGATIVE
Trichomonas, UA: NEGATIVE
Yeast Wet Prep HPF POC: NEGATIVE

## 2018-02-03 MED ORDER — FLUTICASONE PROPIONATE HFA 110 MCG/ACT IN AERO: INHALATION_SPRAY | RESPIRATORY_TRACT | 12 refills | Status: DC

## 2018-02-03 NOTE — Progress Notes (Signed)
Subjective:    Patient ID: Sarah Massey, female    DOB: 02/26/1971, 47 y.o.   MRN: [id number]  HPI  CPE with pap  1.  Pap:  Last in 2013.  Never abnormal.  2.  Mammogram:  Last 07/2016, always normal.  No family history of breast cancer.  3.  Osteoprevention:  Has maybe 2 servings of dairy weekly.  Would be willing to eat or drink 3 servings of low fat dairy daily.   She does perform regular physical activity every day--walks 45 minutes daily and runs as well. Inclement weather--she has a treadmill.  4.  Guaiac Cards:  Has not performed home packet of 3 cards--just checked in clinic before.    5.  Colonoscopy:  Never.  No family history of colon cancer.  6.  Immunizations:   Immunization History  Administered Date(s) Administered  . Influenza Split 06/22/2012  . Influenza-Unspecified 06/03/2017  . Tdap 09/14/2008    7.  Glucose/Cholesterol:  Glucose in September 2018 was normal.  Cholesterol not checked since 2011, was 229 total at one time and she worked down to 177.  LDL with former was 153 with good HDL at 57.  8.  Today's concerns:    Recurrent UTI:  Has had pain in her left low quadrant on and off for past 2 weeks.  Pain with urination.  Some frequency yesterday only.  No gross hematuria.  No fever. No flank tenderness.  Does have malodorous urine as well.  Asthma:  She is using Albuterol inhaler once daily--feels like it is in her throat.  Difficult to get a good history regarding this.  Later, sounds like she is using every day.  Generally when she runs and when she goes to sleep--so in actuality, twice daily.  Current Meds  Medication Sig  . albuterol (PROVENTIL HFA;VENTOLIN HFA) 108 (90 BASE) MCG/ACT inhaler Inhale 1-2 puffs into the lungs every 6 (six) hours as needed for wheezing or shortness of breath.  . cetirizine (ZYRTEC) 10 MG tablet Take 10 mg by mouth daily.   Current Facility-Administered Medications for the 02/03/18 encounter (Office Visit) with  Mack Hook, MD  Medication  . albuterol (PROVENTIL) (2.5 MG/3ML) 0.083% nebulizer solution 2.5 mg   No Known Allergies   Past Medical History:  Diagnosis Date  . Asthma   . Asthma 01/25/2012  . Depression    pt is depressed about her current pregnancy  . Fibroid   . Gestational diabetes    previous pregnancy  . Obesity   . PONV (postoperative nausea and vomiting)   . Urinary tract infection    Past Surgical History:  Procedure Laterality Date  . CESAREAN SECTION  2005  . DILATION AND CURETTAGE OF UTERUS  2007    Family History  Problem Relation Age of Onset  . Thyroid disease Father   . Hypertension Mother   . Diabetes Mother   . Heart disease Mother        MI cause of death  . Fibroids Sister   . Thyroid disease Sister   . Early death Maternal Grandfather   . Thyroid disease Sister   . GER disease Son   . Anesthesia problems Neg Hx    Social History   Socioeconomic History  . Marital status: Married    Spouse name: Evelene Croon  . Number of children: 6  . Years of education: 3  . Highest education level: 3rd grade  Occupational History  . Not on file  Social  Needs  . Financial resource strain: Not very hard  . Food insecurity:    Worry: Sometimes true    Inability: Sometimes true  . Transportation needs:    Medical: No    Non-medical: No  Tobacco Use  . Smoking status: Never Smoker  . Smokeless tobacco: Never Used  Substance and Sexual Activity  . Alcohol use: No  . Drug use: No  . Sexual activity: Yes    Birth control/protection: Condom    Comment: desires nexplanon  Lifestyle  . Physical activity:    Days per week: 7 days    Minutes per session: 60 min  . Stress: Not on file  Relationships  . Social connections:    Talks on phone: More than three times a week    Gets together: More than three times a week    Attends religious service: More than 4 times per year    Active member of club or organization: Not on file    Attends  meetings of clubs or organizations: Not on file    Relationship status: Not on file  . Intimate partner violence:    Fear of current or ex partner: No    Emotionally abused: No    Physically abused: No    Forced sexual activity: No  Other Topics Concern  . Not on file  Social History Narrative   Her 3 oldest children live on their own with their partners   She lives at home with her 3 youngest children and her husband   She does need to use church food pantries at times for enough food.    Review of Systems  Constitutional: Positive for fatigue (Taking care of 2 children who have been ill recently.).  HENT: Negative for postnasal drip, sneezing and sore throat.        Hoarseness since January. Feels like she has water in right ear.   Eyes: Negative for discharge, itching and visual disturbance.  Respiratory: Positive for shortness of breath (when runs or apparently when goes to bed--again feels something in her throat.  Worse upon awakening in morning.). Negative for cough.   Cardiovascular: Negative for chest pain, palpitations and leg swelling.  Gastrointestinal: Positive for blood in stool (no melena or hematochezia).       LLQ pain as noted previously No acid reflux  Genitourinary: Positive for vaginal discharge (with odor.  for 2 days.  No vaginal itching. Last period was 01/11/2018).       See HPI  Musculoskeletal: Negative for arthralgias.  Skin: Negative for rash.  Neurological: Negative for weakness and numbness.  Psychiatric/Behavioral: Negative for dysphoric mood and suicidal ideas. The patient is not nervous/anxious.         Objective:   Physical Exam  Constitutional: She is oriented to person, place, and time. She appears well-developed and well-nourished.  HENT:  Head: Normocephalic and atraumatic.  Right Ear: Hearing, tympanic membrane, external ear and ear canal normal.  Left Ear: Hearing, tympanic membrane, external ear and ear canal normal.  Nose: Nose  normal.  Mouth/Throat: Uvula is midline and oropharynx is clear and moist.  Eyes: Pupils are equal, round, and reactive to light. Conjunctivae and EOM are normal.  Discs sharp bilaterally  Neck: Normal range of motion. Neck supple. Thyromegaly (Diffuse.  No nodules palpated) present.  Cardiovascular: Normal rate, regular rhythm, S1 normal and S2 normal. Exam reveals no S3, no S4 and no friction rub.  No murmur heard. No carotid bruits.  Carotid, radial,  femoral, DP and PT pulses normal and equal.   Pulmonary/Chest: Effort normal and breath sounds normal. Right breast exhibits no inverted nipple, no mass, no nipple discharge, no skin change and no tenderness. Left breast exhibits no inverted nipple, no mass, no nipple discharge, no skin change and no tenderness.  Abdominal: Soft. She exhibits no mass. There is no hepatosplenomegaly. There is tenderness (mild suprapubic tenderness.  No flank tenderness.). No hernia.  Genitourinary: Rectal exam shows guaiac negative stool.  Genitourinary Comments: Mild white to yellow vaginal discharge.  Normal external genitalia.  No uterine or adnexal mass or tenderness.  No CMT.     Musculoskeletal: Normal range of motion.  Lymphadenopathy:       Head (right side): No submental and no submandibular adenopathy present.       Head (left side): No submental and no submandibular adenopathy present.    She has no cervical adenopathy.    She has no axillary adenopathy.       Right: No inguinal and no supraclavicular adenopathy present.       Left: No inguinal and no supraclavicular adenopathy present.  Neurological: She is alert and oriented to person, place, and time. She has normal strength and normal reflexes. No cranial nerve deficit or sensory deficit. Coordination and gait normal.  Skin: Skin is warm. Capillary refill takes less than 2 seconds. No rash noted.  Psychiatric: She has a normal mood and affect. Her behavior is normal. Judgment and thought content  normal.      Assessment & Plan:  1.  CPE with pap Recommend yearly influenza vaccine in the fall. Schedule screening mammogram.  2.  Possible UTI:  UA with mild blood Send urine culture.   Push fluids  3.  Vaginal discharge:  No findings on wet prep/KOH.  Send GC/chlamydia.  4.  Asthma:  Add Floven 110 mcg 1 puff twice daily   5.  Thyromegaly:  Thyroid U/S, TSH with fasting labs in 2 weeks  Followup in 3 months for asthma followup

## 2018-02-03 NOTE — Patient Instructions (Signed)

## 2018-02-05 LAB — URINE CULTURE

## 2018-02-08 ENCOUNTER — Other Ambulatory Visit: Payer: Self-pay | Admitting: Internal Medicine

## 2018-02-08 DIAGNOSIS — Z1231 Encounter for screening mammogram for malignant neoplasm of breast: Secondary | ICD-10-CM

## 2018-02-08 LAB — CYTOLOGY - PAP

## 2018-02-10 LAB — GC/CHLAMYDIA PROBE AMP
Chlamydia trachomatis, NAA: NEGATIVE
Neisseria gonorrhoeae by PCR: NEGATIVE

## 2018-02-22 ENCOUNTER — Encounter: Payer: Self-pay | Admitting: Internal Medicine

## 2018-02-22 ENCOUNTER — Ambulatory Visit: Payer: Self-pay | Admitting: Internal Medicine

## 2018-02-22 VITALS — BP 134/80 | HR 95 | Temp 98.2°F | Ht 60.0 in | Wt 168.0 lb

## 2018-02-22 DIAGNOSIS — J0140 Acute pansinusitis, unspecified: Secondary | ICD-10-CM

## 2018-02-22 DIAGNOSIS — J45901 Unspecified asthma with (acute) exacerbation: Secondary | ICD-10-CM

## 2018-02-22 MED ORDER — PREDNISONE 20 MG PO TABS
ORAL_TABLET | ORAL | 0 refills | Status: DC
Start: 2018-02-22 — End: 2018-03-10

## 2018-02-22 MED ORDER — PREDNISONE 20 MG PO TABS: ORAL_TABLET | ORAL | 0 refills | Status: DC

## 2018-02-22 MED ORDER — AZITHROMYCIN 500 MG PO TABS: ORAL_TABLET | ORAL | 0 refills | Status: DC

## 2018-02-22 MED ORDER — ALBUTEROL SULFATE (2.5 MG/3ML) 0.083% IN NEBU: 2.5000 mg | INHALATION_SOLUTION | Freq: Once | RESPIRATORY_TRACT | Status: DC

## 2018-02-22 NOTE — Progress Notes (Signed)
   Subjective:    Patient ID: Sarah Massey, female    DOB: 06-29-1971, 47 y.o.   MRN: [id number]  HPI   Last week started coughing.  Became short winded yesterday.   However, started using her rescue inhaler, albuterol twice daily about 1 week ago.  Yesterday, used it 4 times and has used twice today.  Does not seem to be helping. Started coughing up green mucous yesterday.  Feels hot at times and cold others.  Has not taken temp.  Last antipyretic was at 1 p.m.:  Ibuprofen 800 mg.    She has been using her Cetirizine 10 mg daily for months and Flovent110 mcg twice daily without fail since started following CPE on 02/03/18. No one else in home or at work ill.  Current Meds  Medication Sig  . albuterol (PROVENTIL HFA;VENTOLIN HFA) 108 (90 BASE) MCG/ACT inhaler Inhale 1-2 puffs into the lungs every 6 (six) hours as needed for wheezing or shortness of breath.  . cetirizine (ZYRTEC) 10 MG tablet Take 10 mg by mouth daily.  . fluticasone (FLOVENT HFA) 110 MCG/ACT inhaler 1 puff twice daily followed by brushing teeth and tongue   No Known Allergies  Review of Systems     Objective:   Physical Exam  NAD HEENT:  Tender over frontal and maxillary sinuses. PERRL, EOMI, TMs dull bilaterally, but without injection.  Nasal mucosa red and swollen with yellow discharge.  Throat without injection.  MMM Neck:  Supple, No adenopathy Chest:  Diffuse expiratory wheeze without crackles. CV:  RRR without murmur or rub, radial pulses normal and equal.       Lungs:  CTA after albuterol 2.5 mg neb with WPF of 250 Assessment & Plan:  1.  Acute sinusitis:   Azithromycin 500 mg daily for 5 days. Push fluids.  2.  Asthma exacerbation with URI:  Prednisone 30 mg daily using 20 mg tabs 1.5 daily for 5 days. Albuterol HFA  Every 4-6 hours as needed.  Follow up 1 week--call if worsens or not improving.

## 2018-02-24 ENCOUNTER — Other Ambulatory Visit: Payer: Self-pay

## 2018-02-24 DIAGNOSIS — E010 Iodine-deficiency related diffuse (endemic) goiter: Secondary | ICD-10-CM

## 2018-02-24 DIAGNOSIS — Z79899 Other long term (current) drug therapy: Secondary | ICD-10-CM

## 2018-02-24 DIAGNOSIS — Z13220 Encounter for screening for lipoid disorders: Secondary | ICD-10-CM

## 2018-02-25 LAB — LIPID PANEL W/O CHOL/HDL RATIO
Cholesterol, Total: 195 mg/dL (ref 100–199)
HDL: 37 mg/dL — AB (ref >39)
LDL Calculated: 129 mg/dL — ABNORMAL HIGH (ref 0–99)
TRIGLYCERIDES: 147 mg/dL (ref 0–149)
VLDL Cholesterol Cal: 29 mg/dL (ref 5–40)

## 2018-02-25 LAB — COMPREHENSIVE METABOLIC PANEL
ALBUMIN: 4.2 g/dL (ref 3.5–5.5)
ALK PHOS: 82 IU/L (ref 39–117)
ALT: 21 IU/L (ref 0–32)
AST: 15 IU/L (ref 0–40)
Albumin/Globulin Ratio: 1.4 (ref 1.2–2.2)
BUN / CREAT RATIO: 27 — AB (ref 9–23)
BUN: 17 mg/dL (ref 6–24)
CHLORIDE: 102 mmol/L (ref 96–106)
CO2: 23 mmol/L (ref 20–29)
Calcium: 8.9 mg/dL (ref 8.7–10.2)
Creatinine, Ser: 0.62 mg/dL (ref 0.57–1.00)
GFR calc Af Amer: 124 mL/min/{1.73_m2} (ref >59)
GFR calc non Af Amer: 108 mL/min/{1.73_m2} (ref >59)
GLOBULIN, TOTAL: 3 g/dL (ref 1.5–4.5)
GLUCOSE: 97 mg/dL (ref 65–99)
POTASSIUM: 4.3 mmol/L (ref 3.5–5.2)
Sodium: 139 mmol/L (ref 134–144)
Total Protein: 7.2 g/dL (ref 6.0–8.5)

## 2018-02-25 LAB — CBC WITH DIFFERENTIAL/PLATELET
BASOS ABS: 0 10*3/uL (ref 0.0–0.2)
Basos: 0 %
EOS (ABSOLUTE): 0.2 10*3/uL (ref 0.0–0.4)
Eos: 2 %
Hematocrit: 37.7 % (ref 34.0–46.6)
Hemoglobin: 12.6 g/dL (ref 11.1–15.9)
Immature Grans (Abs): 0 10*3/uL (ref 0.0–0.1)
Immature Granulocytes: 0 %
LYMPHS ABS: 5.2 10*3/uL — AB (ref 0.7–3.1)
Lymphs: 61 %
MCH: 29.4 pg (ref 26.6–33.0)
MCHC: 33.4 g/dL (ref 31.5–35.7)
MCV: 88 fL (ref 79–97)
Monocytes Absolute: 0.5 10*3/uL (ref 0.1–0.9)
Monocytes: 5 %
Neutrophils Absolute: 2.8 10*3/uL (ref 1.4–7.0)
Neutrophils: 32 %
Platelets: 232 10*3/uL (ref 150–450)
RBC: 4.29 x10E6/uL (ref 3.77–5.28)
RDW: 15.5 % — AB (ref 12.3–15.4)
WBC: 8.7 10*3/uL (ref 3.4–10.8)

## 2018-02-25 LAB — TSH: TSH: 8.26 u[IU]/mL — ABNORMAL HIGH (ref 0.450–4.500)

## 2018-03-02 ENCOUNTER — Ambulatory Visit
Admission: RE | Admit: 2018-03-02 | Discharge: 2018-03-02 | Disposition: A | Discharge status: Home / Self Care | Payer: No Typology Code available for payment source | Source: Ambulatory Visit | Attending: Internal Medicine | Admitting: Internal Medicine

## 2018-03-02 DIAGNOSIS — Z1231 Encounter for screening mammogram for malignant neoplasm of breast: Secondary | ICD-10-CM

## 2018-03-10 ENCOUNTER — Encounter: Payer: Self-pay | Admitting: Internal Medicine

## 2018-03-10 ENCOUNTER — Other Ambulatory Visit: Payer: Self-pay

## 2018-03-10 ENCOUNTER — Ambulatory Visit: Payer: Self-pay | Admitting: Internal Medicine

## 2018-03-10 VITALS — BP 124/82 | HR 82 | Temp 98.0°F | Resp 12 | Ht 60.0 in | Wt 168.0 lb

## 2018-03-10 DIAGNOSIS — E010 Iodine-deficiency related diffuse (endemic) goiter: Secondary | ICD-10-CM

## 2018-03-10 DIAGNOSIS — J029 Acute pharyngitis, unspecified: Secondary | ICD-10-CM

## 2018-03-10 MED ORDER — HYDROCOD POLST-CPM POLST ER 10-8 MG/5ML PO SUER: 5.0000 mL | Freq: Two times a day (BID) | ORAL | 0 refills | Status: DC | PRN

## 2018-03-10 MED ORDER — IBUPROFEN 200 MG PO TABS: ORAL_TABLET | ORAL | 0 refills | Status: DC

## 2018-03-10 NOTE — Progress Notes (Signed)
   Subjective:    Patient ID: Sarah Massey, female    DOB: 11/02/1970, 47 y.o.   MRN: [id number]  HPI   See visit from 02/22/2018 with allergy and asthma exacerbation/sinusitis. Finished Azithromycin in 5 days. Prednisone 30 mg daily for 5 days.   States she continues to have a cough and when she cough, her throat hurts more.  Throat hurts regardless of cough, just worse with cough.   No eye, ear, nasal symptoms.  No posterior pharyngeal drainage. She feels her lower airway concerns have resolved, her only problem is her throat and cough.  Gets an itch in throat and then cannot stop coughing and catch her breath. She has not needed her Albuterol for over a week.   She is using Cetirizine and Flovent as ordered. No fevers. Having paroxysms of cough every 2 hours where frightened she cannot breath.  Current Meds  Medication Sig  . albuterol (PROVENTIL HFA;VENTOLIN HFA) 108 (90 BASE) MCG/ACT inhaler Inhale 1-2 puffs into the lungs every 6 (six) hours as needed for wheezing or shortness of breath.  . cetirizine (ZYRTEC) 10 MG tablet Take 10 mg by mouth daily.  . fluticasone (FLOVENT HFA) 110 MCG/ACT inhaler 1 puff twice daily followed by brushing teeth and tongue   Current Facility-Administered Medications for the 03/10/18 encounter (Office Visit) with Mack Hook, MD  Medication  . albuterol (PROVENTIL) (2.5 MG/3ML) 0.083% nebulizer solution 2.5 mg    No Known Allergies   Review of Systems     Objective:   Physical Exam  NAD until begins to cough--as soon as starts coughing, appears very anxious and grabs at throat, appears to almost hold her breath.  Hard cough HEENT:  PERRL, EOMI, TMs pearly gray, nasal mucosa slightly swollen and boggy.  Throat with mild injection, but no exudate. Neck:  Supple, No adenopathy Chest:  CTA, no upper airway noise including stridor. CV:  RRR without murmur or rub.  Radial pulses normal and equal       Assessment & Plan:    Suspect upper airway irritation from her coughing:  Suppress cough with Tussionex 5 ml twice daily.  120 ml. Drink cool non acidic fluids Soft solids

## 2018-03-11 LAB — T4, FREE: Free T4: 0.91 ng/dL (ref 0.82–1.77)

## 2018-04-14 ENCOUNTER — Ambulatory Visit: Payer: Self-pay | Admitting: Internal Medicine

## 2018-04-14 ENCOUNTER — Encounter: Payer: Self-pay | Admitting: Internal Medicine

## 2018-04-14 VITALS — BP 112/80 | HR 88 | Resp 12 | Ht 60.0 in | Wt 167.0 lb

## 2018-04-14 DIAGNOSIS — R05 Cough: Secondary | ICD-10-CM

## 2018-04-14 DIAGNOSIS — R059 Cough, unspecified: Secondary | ICD-10-CM

## 2018-04-14 DIAGNOSIS — R490 Dysphonia: Secondary | ICD-10-CM

## 2018-04-14 MED ORDER — BENZONATATE 100 MG PO CAPS: 100.0000 mg | ORAL_CAPSULE | Freq: Three times a day (TID) | ORAL | 0 refills | Status: DC | PRN

## 2018-04-14 MED ORDER — OMEPRAZOLE 40 MG PO CPDR: DELAYED_RELEASE_CAPSULE | ORAL | 0 refills | Status: DC

## 2018-05-19 ENCOUNTER — Encounter: Payer: Self-pay | Admitting: Internal Medicine

## 2018-05-19 ENCOUNTER — Ambulatory Visit: Payer: Self-pay | Admitting: Internal Medicine

## 2018-05-19 VITALS — BP 118/82 | HR 70 | Resp 12 | Ht 60.0 in | Wt 167.0 lb

## 2018-05-19 DIAGNOSIS — R490 Dysphonia: Secondary | ICD-10-CM

## 2018-05-19 DIAGNOSIS — R7989 Other specified abnormal findings of blood chemistry: Secondary | ICD-10-CM

## 2018-05-19 MED ORDER — FAMOTIDINE 20 MG PO TABS: ORAL_TABLET | ORAL | 6 refills | Status: DC

## 2018-05-19 NOTE — Progress Notes (Signed)
   Subjective:    Patient ID: Taccara Bushnell, female    DOB: [birth year], 47 y.o.   MRN: [id number]  HPI   Was seen 04/14/2018 by Dr. Carolann Littler with continued problems with cough, and hoarseness.  Had been seen twice in June for cough and wheezing with findings supporting asthma exacerbation/allergies/sinusitis; treated with prednisone burst and Azithromycin. Second visit end of June more consistent with upper airway irritation from coughing and placed on Tussionex. When seen in August, felt to possibly have silent reflux and given trial of Omeprazole 40  mg twice daily for 3 weeks.   Also given Tessalon perles tid for 10 days. No improvement with her symptoms with treatment. She did stop the Ibuprofen as well.  Difficulty getting a clear history. Still having problems with a cough--generally occurs when gets up in morning and feels like something caught in her throat--has to cough and clear.  Notes especially with her swallowing her Vitamin D pill in the morning.   She is not having itchy, watery nose or sneezing.  No eye symptoms.  No itchy or irritated throat.  Just feels like something is stuck in throat. She talks a lot all day--talks out loud to herself.  Has not rested her voice.    She has continued on her allergy medications as well.    No sense of acid reflux still.    No history of tobacco use.  Eats lots of tomatoes, onions, peppers,  No alcohol. Uses Ibuprofen 200 mg 3 times weekly. She has not elevated HOB.  Current Meds  Medication Sig  . albuterol (PROVENTIL HFA;VENTOLIN HFA) 108 (90 BASE) MCG/ACT inhaler Inhale 1-2 puffs into the lungs every 6 (six) hours as needed for wheezing or shortness of breath.  . benzonatate (TESSALON) 100 MG capsule Take 1 capsule (100 mg total) by mouth 3 (three) times daily as needed for cough.  . cetirizine (ZYRTEC) 10 MG tablet Take 10 mg by mouth daily.  . fluticasone (FLOVENT HFA) 110 MCG/ACT inhaler 1 puff twice daily  followed by brushing teeth and tongue    No Known Allergies    Review of Systems     Objective:   Physical Exam  Hoarse voice HEENT; PERRL, EOMI, TMs pearly gay, throat without injection or exudate, no posterior pharyngeal cobbling noted.  Left nasal mucosa swollen and erythematous. Neck:  Mild thyroid enlargement, smooth, No adenopathy Chest:  CTA CV:  RRR without murmur or rub.  Radial pulses normal and equal.      Assessment & Plan:   1.  Chronic Hoarseness and cough with dysphagia:  Discussed at length GERD precautions, especially elevating HOB. Start Famotidine 20 mg, 2 tabs at bedtime daily. ENT referral as well--suspect will need to take a look at larynx. To use allergy medication regularly  2.  Thyromegaly:  Due for recheck of TSH and free T4.  Has had suppressed TSH and mildly elevate TSH with FT4 in normal range.    Follow up in 1 month

## 2018-05-19 NOTE — Patient Instructions (Signed)
Enfermedad de reflujo gastroesofágico en los adultos  Gastroesophageal Reflux Disease, Adult  Normalmente, la comida baja por el esófago y se mantiene en el estómago, donde se la digiere. Sin embargo, cuando una persona tiene enfermedad de reflujo gastroesofágico (ERGE), la comida y jugo gástrico (ácido estomacal) suben por el esófago. Cuando esto ocurre, el esófago se inflama y duele. Con el tiempo, la ERGE puede producir pequeños agujeros (úlceras) en el revestimiento del esófago.  ¿Cuáles son las causas?  Esta afección se debe a un problema en el músculo que se encuentra entre el esófago y el estómago (esfínter esofágico inferior, o EEI). Normalmente, el EEI se cierra una vez que la comida pasa a través del esófago hasta el estómago. Cuando el EEI se encuentra debilitado o tiene alguna anomalía, no se cierra por completo, y eso permite que tanto la comida como el jugo gástrico, que es ácido, vuelvan a subir por el esófago. El EEI puede debilitarse a causa de ciertas sustancias alimenticias, medicamentos y afecciones, que incluyen:  · Consumo de tabaco.  · Embarazo.  · Tener una hernia de hiato.  · Consumo excesivo de alcohol.  · Ciertos alimentos y bebidas, como café, chocolate, cebollas y menta.    ¿Qué incrementa el riesgo?  Es más probable que esta afección se manifieste en:  · Personas con aumento del peso corporal.  · Personas con trastornos del tejido conjuntivo.  · Personas que toman antiinflamatorios no esteroideos (AINE).    ¿Cuáles son los signos o los síntomas?  Los síntomas de esta afección incluyen lo siguiente:  · Acidez estomacal.  · Dificultad o dolor al tragar.  · Sensación de tener un bulto en la garganta.  · Sabor amargo en la boca.  · Mal aliento.  · Gran cantidad de saliva.  · Estómago inflamado o con malestar.  · Eructos.  · Dolor en el pecho.  · Dificultad para respirar o sibilancias.  · Tos constante (crónica) o tos nocturna.  · Desgaste del esmalte dental.  · Pérdida de peso.     El dolor de pecho puede deberse a distintas enfermedades. Es importante que consulte al médico si tiene dolor de pecho.  ¿Cómo se diagnostica?  El médico le hará una historia clínica y un examen físico. Para determinar si tiene ERGE leve o grave, el médico también puede controlar cómo usted reacciona al tratamiento. También pueden hacerle otros estudios, por ejemplo:  · Una endoscopía para examinarle el estómago y el esófago con una cámara pequeña.  · Una prueba para medir el grado de acidez en el esófago.  · Una prueba para medir cuánta presión hay en el esófago.  · Un estudio de tránsito baritado regular o modificado para ver la forma, el tamaño y el funcionamiento del esófago.    ¿Cómo se trata?  El objetivo del tratamiento es ayudar a aliviar los síntomas y evitar las complicaciones. El tratamiento de esta afección puede variar según la gravedad de los síntomas. El médico puede recomendarle lo siguiente:  · Cambios en la dieta.  · Medicamentos.  · Someterse a una cirugía.    Siga estas instrucciones en su casa:  Dieta  · Siga la dieta recomendada por el médico. Esto puede incluir evitar ciertos alimentos y bebidas, por ejemplo:  ? Café y té (con o sin cafeína).  ? Bebidas que contengan alcohol.  ? Bebidas energéticas y deportivas.  ? Bebidas gaseosas y refrescos.  ? Chocolate y cacao.  ? Menta y esencia de menta.  ?   Ajo y cebolla.  ? Rábano picante.  ? Alimentos condimentados, picantes y ácidos, por ejemplo, todos los tipos de pimientas, chile en polvo, curry en polvo, vinagre, salsas picantes y salsa barbacoa.  ? Cítricos y sus jugos, por ejemplo, naranjas, limones y limas.  ? Alimentos a base de tomate, como salsa de tomate, chile, salsa picante y pizza con salsa de tomate.  ? Alimentos fritos y grasos, como donas, papas fritas y aderezos ricos en grasas.  ? Carnes con alto contenido de grasa, como salchichas, y cortes de carnes rojas y blancas con mucha grasa, por ejemplo, chuletas o costillas,  embutidos, jamón y tocino.  ? Productos lácteos ricos en grasas, como leche entera, manteca y queso crema.  · Haga varias comidas pequeñas y frecuentes durante el día en lugar de comidas abundantes.  · Evite beber grandes cantidades de líquidos con las comidas.  · Evite comer 2 o 3 horas antes de acostarse.  · Evite recostarse inmediatamente después de comer.  · No haga ejercicios enseguida después de comer.  Instrucciones generales  · Esté atento a cualquier cambio en los síntomas.  · Tome los medicamentos de venta libre y los recetados solamente como se lo haya indicado el médico. No tome aspirina, ibuprofeno ni otros antiinflamatorios no esteroideos (AINE) a menos que el médico se lo indique.  · No consuma ningún producto que contenga tabaco, lo que incluye cigarrillos, tabaco de mascar y cigarrillos electrónicos. Si necesita ayuda para dejar de fumar, consulte al médico.  · Use ropas sueltas. No use nada apretado alrededor de la cintura que haga presión sobre el abdomen.  · Levante (eleve) la cabecera de la cama 6 pulgadas (15 cm).  · Trate de reducir el nivel de estrés con actividades tales como el yoga o la meditación. Si necesita ayuda para reducir el nivel de estrés, consulte al médico.  · Si tiene sobrepeso, baje hasta llegar a un peso saludable para usted. Pídale consejos al médico para bajar de peso de manera segura.  · Concurra a todas las visitas de control como se lo haya indicado el médico. Esto es importante.  Comuníquese con un médico si:  · Aparecen nuevos síntomas.  · Baja de peso sin causa aparente.  · Tiene dificultad para tragar, o le duele cuando traga.  · Tiene tos persistente o sibilancias.  · Los síntomas no mejoran con el tratamiento.  · Tiene la voz ronca.  Solicite ayuda de inmediato si:  · Siente dolor en los brazos, el cuello, la mandíbula, los dientes o la espalda.  · Se siente transpirado, mareado o tiene una sensación de desvanecimiento.  · Siente falta de aire o dolor en el pecho.   · Vomita y el vómito tiene un aspecto similar a la sangre o a los granos de café.  · Se desmaya.  · Las heces son sanguinolentas o negras.  · No puede tragar, beber o comer.  Esta información no tiene como fin reemplazar el consejo del médico. Asegúrese de hacerle al médico cualquier pregunta que tenga.  Document Released: 06/10/2005 Document Revised: 12/04/2016 Document Reviewed: 12/26/2014  Elsevier Interactive Patient Education © 2018 [employer]

## 2018-05-20 ENCOUNTER — Encounter: Payer: Self-pay | Admitting: Internal Medicine

## 2018-05-20 DIAGNOSIS — R490 Dysphonia: Secondary | ICD-10-CM | POA: Insufficient documentation

## 2018-05-20 DIAGNOSIS — R7989 Other specified abnormal findings of blood chemistry: Secondary | ICD-10-CM | POA: Insufficient documentation

## 2018-05-20 LAB — T4, FREE: Free T4: 0.95 ng/dL (ref 0.82–1.77)

## 2018-05-20 LAB — TSH: TSH: 3.43 u[IU]/mL (ref 0.450–4.500)

## 2018-05-20 NOTE — Progress Notes (Signed)
   Subjective:    Patient ID: Sarah Massey, female    DOB: Oct 26, 1970, 47 y.o.   MRN: [id number]  HPI   Transcription of written note by Dr. Carolann Littler. Please see scanned in written note.  Ongoing throat issue.  Patient feels like something is caught in her throat x 3 months.  Cough + trouble breathing, eyes water; (coughing fits + then ok) non productive No nasal discharge Mild hoarseness-constant (since January) - started before cough. occ frontal h/a + S.T. Afraid of throat cancer  Taking ibuprofen for h/a --2x/wk   Current Meds  Medication Sig  . albuterol (PROVENTIL HFA;VENTOLIN HFA) 108 (90 BASE) MCG/ACT inhaler Inhale 1-2 puffs into the lungs every 6 (six) hours as needed for wheezing or shortness of breath.  . cetirizine (ZYRTEC) 10 MG tablet Take 10 mg by mouth daily.   No Known Allergies     Review of Systems     Objective:   Physical Exam Skin:  No rash HEENT:  Oropharynx benign.  Voice slightly "scratchy"  No sinus tenderness Neck:  Non tender.  Thyroid prominent? No masses.  Symmetric Chest:  Lungs clear No wheezes CV:  RRR Abdomen/pelvis:  NT Extremities:  No LLE      Assessment & Plan:  1.  Chronic cough, hoarseness + possible silent reflux--Trial of Prevacid 30 mg for next 2-3 wks and if still coughing + hoarse all that time, refer to ENT for laryngoscopy/thyroid u/s.   Tessalon 100 mg 1 po TID prn  #30/NRF Prevacid 30 mg BID #60/NRF--(or omeprazole 40 mg BID #60/NRF) F/U with Dr. Amil Amen in 3 wks - re check Stop ibuprofen--use Tylenol if needed.

## 2018-05-23 ENCOUNTER — Other Ambulatory Visit: Payer: Self-pay

## 2018-06-10 ENCOUNTER — Other Ambulatory Visit: Payer: Self-pay

## 2018-06-10 MED ORDER — ALBUTEROL SULFATE HFA 108 (90 BASE) MCG/ACT IN AERS: 1.0000 | INHALATION_SPRAY | Freq: Four times a day (QID) | RESPIRATORY_TRACT | 2 refills | Status: DC | PRN

## 2018-06-16 ENCOUNTER — Ambulatory Visit: Payer: Self-pay | Admitting: Internal Medicine

## 2018-06-16 ENCOUNTER — Encounter: Payer: Self-pay | Admitting: Internal Medicine

## 2018-06-16 VITALS — BP 118/80 | HR 74 | Resp 12 | Ht 60.0 in | Wt 168.0 lb

## 2018-06-16 DIAGNOSIS — R300 Dysuria: Secondary | ICD-10-CM

## 2018-06-16 DIAGNOSIS — R102 Pelvic and perineal pain: Secondary | ICD-10-CM

## 2018-06-16 DIAGNOSIS — J45909 Unspecified asthma, uncomplicated: Secondary | ICD-10-CM

## 2018-06-16 DIAGNOSIS — J3089 Other allergic rhinitis: Secondary | ICD-10-CM

## 2018-06-16 DIAGNOSIS — R490 Dysphonia: Secondary | ICD-10-CM

## 2018-06-16 LAB — POCT URINALYSIS DIPSTICK
Bilirubin, UA: NEGATIVE
Glucose, UA: NEGATIVE
Ketones, UA: NEGATIVE
Leukocytes, UA: NEGATIVE
NITRITE UA: NEGATIVE
PH UA: 6.5 (ref 5.0–8.0)
PROTEIN UA: NEGATIVE
Spec Grav, UA: <=1.005 — AB (ref 1.010–1.025)
UROBILINOGEN UA: 0.2 U/dL

## 2018-06-16 LAB — POCT URINE PREGNANCY: PREG TEST UR: NEGATIVE

## 2018-06-16 MED ORDER — NITROFURANTOIN MONOHYD MACRO 100 MG PO CAPS: ORAL_CAPSULE | ORAL | 0 refills | Status: DC

## 2018-06-16 NOTE — Progress Notes (Signed)
   Subjective:    Patient ID: Sarah Massey, female    DOB: February 23, 1971, 47 y.o.   MRN: [id number]  HPI   1.  Hoarseness:  Better with Famotidine.  Feels her hoarseness is gone.  She did make changes with her food and position after eating/HOB elevation, etc.     2.  Asthma/allergies:  Not using Flovent more than 2 puffs once daily.  Only uses with changes in weather.  Using Albuterol, however, every day once daily.  Discussed at length, she should be using the Flovent twice daily to keep her symptoms and wheezing under control so she does not need the rescue inhaler daily. Goal is to keep symptoms under control so does not need her albuterol more than once weekly. She seems to understand--had her repeat. Feels her allergies are controlled  3.  Urinary frequency:  Has been a problem for 1 week.  Up at night.  Burning with urination.  Pain in left pelvic area.  No fever.  No gross hematuria.  Having white particles in her urine.  Bad odor with her urine as well.  Drinking lots of water. No vaginal discharge. No period since June.  She and her husband use condoms religiously.  Current Meds  Medication Sig  . albuterol (PROVENTIL HFA;VENTOLIN HFA) 108 (90 Base) MCG/ACT inhaler Inhale 1-2 puffs into the lungs every 6 (six) hours as needed for wheezing or shortness of breath.  . cetirizine (ZYRTEC) 10 MG tablet Take 10 mg by mouth daily.  . famotidine (PEPCID) 20 MG tablet 2 tabs by mouth at bedtime daily  . fluticasone (FLOVENT HFA) 110 MCG/ACT inhaler 1 puff twice daily followed by brushing teeth and tongue  . ibuprofen (ADVIL,MOTRIN) 200 MG tablet 2-4 pastillas cada 6 horas a necesita dolor de garganta siempre con comida  . [DISCONTINUED] benzonatate (TESSALON) 100 MG capsule Take 1 capsule (100 mg total) by mouth 3 (three) times daily as needed for cough.    Review of Systems     Objective:   Physical Exam  NAD Voice now clear and without hoarseness. HEENT:  PERRL, EOMI,  TMs pearly gray, throat without injection.  No oral lesions. Neck: Supple, No adenopathy Chest:  CTA CV:  RRR without murmur or rub.  Radial pulses normal and equal Abd:  S, + BS, No HSM or mass, tenderness mildly in LUQ, more so in LLQ.  No suprapubic tenderness.  No flank tenderness        Assessment & Plan:  1.  Hoarseness/GERD:  Appears to be due to GERD as resolved with treatment of the latter.   CPM.  To take Famotidine for total of 4 months and then wean  2.  History of recurrent UTI with new symptoms again.  UA really only with microscopic hematuria.  Macrobid 100 mg twice daily for 7 days.   Urine culture. Checking Urine HCG as well   3.  Asthma:  Seems to understand importance of using maintenance medication for asthma regularly and to use rescue inhaler only as needed.    4.  Allergies:  Again, seems to understand need to control with maintenance meds to prevent trigger of asthma.

## 2018-06-18 LAB — URINE CULTURE

## 2018-06-27 ENCOUNTER — Ambulatory Visit: Payer: Self-pay | Admitting: Internal Medicine

## 2018-06-27 ENCOUNTER — Encounter: Payer: Self-pay | Admitting: Internal Medicine

## 2018-06-27 VITALS — BP 128/80 | HR 76 | Resp 12 | Ht 60.0 in | Wt 171.0 lb

## 2018-06-27 DIAGNOSIS — R300 Dysuria: Secondary | ICD-10-CM

## 2018-06-27 DIAGNOSIS — N898 Other specified noninflammatory disorders of vagina: Secondary | ICD-10-CM

## 2018-06-27 LAB — POCT URINALYSIS DIPSTICK
Bilirubin, UA: NEGATIVE
Glucose, UA: NEGATIVE
KETONES UA: NEGATIVE
Nitrite, UA: NEGATIVE
PH UA: 6.5 (ref 5.0–8.0)
Protein, UA: POSITIVE — AB
SPEC GRAV UA: 1.02 (ref 1.010–1.025)
UROBILINOGEN UA: 0.2 U/dL

## 2018-06-27 LAB — POCT WET PREP WITH KOH
KOH Prep POC: NEGATIVE
RBC WET PREP PER HPF POC: NEGATIVE
Trichomonas, UA: NEGATIVE

## 2018-06-27 MED ORDER — METRONIDAZOLE 500 MG PO TABS: ORAL_TABLET | ORAL | 0 refills | Status: DC

## 2018-06-27 MED ORDER — FLUCONAZOLE 150 MG PO TABS: ORAL_TABLET | ORAL | 0 refills | Status: DC

## 2018-06-27 NOTE — Progress Notes (Signed)
   Subjective:    Patient ID: Sarah Massey, female    DOB: 1970-10-09, 47 y.o.   MRN: [id number]  HPI   Urinary frequency with dysuria.  Her low back hurts bilaterally.  No fever.   We took a urine sample at recent visit for same symptoms and urine culture negative.   She had been prescribed Macrobid and took the entire course, despite a negative culture and has not noted any improvement. States her underwear is wet--not clear she is having urinary incontinence.   She is having vaginal discharge with a fishy smell.  Current Meds  Medication Sig  . albuterol (PROVENTIL HFA;VENTOLIN HFA) 108 (90 Base) MCG/ACT inhaler Inhale 1-2 puffs into the lungs every 6 (six) hours as needed for wheezing or shortness of breath.  . cetirizine (ZYRTEC) 10 MG tablet Take 10 mg by mouth daily.  . famotidine (PEPCID) 20 MG tablet 2 tabs by mouth at bedtime daily  . fluticasone (FLOVENT HFA) 110 MCG/ACT inhaler 1 puff twice daily followed by brushing teeth and tongue   No Known Allergies  Review of Systems     Objective:   Physical Exam NAD Lungs:  CTA CV: RRR without murmur or rub. Abd:  No flank tenderness.  Mild tenderness over suprapubic area.  + BS.  No HSM or mass, GU:  Yellow white discharge.  No blood noted.  Minimal vaginal mucosal inflammation.  No CMT.  No uterine or adnexal mass.  Tender on palpation of uterus, but also in suprapubic area.   Wet prep/KOH:  + budding yeast and clue cells with mild whiff.    Assessment & Plan:  1.  Vaginitis: Fungal and BV:  Fluconazole 150 mg first day with Metronidazole 500 mg twice daily for 7 days.  To retreat with Fluconazole 150 mg daily days 8 and 9. Sending GC/chlamydia as well  2.  Microscopic hematuria with dysuria:  Sending urine culture again.  No obvious blood to contaminate from vaginal source. followup in 2 weeks with UA whether culture positive or not.

## 2018-06-28 ENCOUNTER — Telehealth: Payer: Self-pay | Admitting: Internal Medicine

## 2018-06-29 LAB — GC/CHLAMYDIA PROBE AMP
CHLAMYDIA, DNA PROBE: NEGATIVE
Neisseria gonorrhoeae by PCR: NEGATIVE

## 2018-06-29 LAB — URINE CULTURE

## 2018-07-05 NOTE — Telephone Encounter (Signed)
Error

## 2018-07-12 ENCOUNTER — Other Ambulatory Visit: Payer: Self-pay

## 2018-07-12 DIAGNOSIS — N3001 Acute cystitis with hematuria: Secondary | ICD-10-CM

## 2018-07-12 LAB — POCT URINALYSIS DIPSTICK
BILIRUBIN UA: NEGATIVE
GLUCOSE UA: NEGATIVE
Ketones, UA: NEGATIVE
Leukocytes, UA: NEGATIVE
Nitrite, UA: NEGATIVE
PH UA: 7 (ref 5.0–8.0)
Protein, UA: NEGATIVE
UROBILINOGEN UA: 0.2 U/dL

## 2018-07-25 ENCOUNTER — Other Ambulatory Visit: Payer: Self-pay

## 2018-07-25 DIAGNOSIS — R319 Hematuria, unspecified: Secondary | ICD-10-CM

## 2018-07-25 DIAGNOSIS — R3129 Other microscopic hematuria: Secondary | ICD-10-CM

## 2018-07-25 LAB — POCT URINALYSIS DIPSTICK
Bilirubin, UA: NEGATIVE
GLUCOSE UA: NEGATIVE
Ketones, UA: NEGATIVE
LEUKOCYTES UA: NEGATIVE
NITRITE UA: NEGATIVE
PROTEIN UA: NEGATIVE
Spec Grav, UA: <=1.005 — AB (ref 1.010–1.025)
Urobilinogen, UA: 0.2 E.U./dL
pH, UA: 7.5 (ref 5.0–8.0)

## 2018-07-25 NOTE — Progress Notes (Signed)
Patient came in for repeat UA. Patient still with small amount of blood in urine. Patient states she has not had a menstrual cycle since June. Informed we will contact patient with further direction Dr. Amil Amen. Patient verbalized understanding.

## 2018-07-29 ENCOUNTER — Other Ambulatory Visit: Payer: Self-pay

## 2018-10-20 ENCOUNTER — Ambulatory Visit: Payer: Self-pay | Admitting: Internal Medicine

## 2018-10-20 ENCOUNTER — Encounter: Payer: Self-pay | Admitting: Internal Medicine

## 2018-10-20 VITALS — BP 132/84 | HR 74 | Temp 99.2°F | Resp 12 | Ht 60.0 in | Wt 173.0 lb

## 2018-10-20 DIAGNOSIS — B349 Viral infection, unspecified: Secondary | ICD-10-CM

## 2018-10-20 DIAGNOSIS — R350 Frequency of micturition: Secondary | ICD-10-CM

## 2018-10-20 DIAGNOSIS — R3129 Other microscopic hematuria: Secondary | ICD-10-CM

## 2018-10-20 DIAGNOSIS — K219 Gastro-esophageal reflux disease without esophagitis: Secondary | ICD-10-CM

## 2018-10-20 LAB — POCT URINALYSIS DIPSTICK
BILIRUBIN UA: NEGATIVE
GLUCOSE UA: NEGATIVE
Ketones, UA: NEGATIVE
Leukocytes, UA: NEGATIVE
Nitrite, UA: NEGATIVE
PH UA: 7.5 (ref 5.0–8.0)
Protein, UA: NEGATIVE
Spec Grav, UA: <=1.005 — AB (ref 1.010–1.025)
Urobilinogen, UA: 0.2 E.U./dL

## 2018-10-20 MED ORDER — OSELTAMIVIR PHOSPHATE 75 MG PO CAPS: ORAL_CAPSULE | ORAL | 0 refills | Status: DC

## 2018-10-20 NOTE — Progress Notes (Signed)
    Subjective:    Patient ID: Sarah Massey, female   DOB: 1971-07-15, 48 y.o.   MRN: [id number]   HPI   1.  Started feeling ill yesterday at 3 p.m.  Cough, sore throat, warm head, chills, body aches.  States cough is nonproductive.  Started with a fever this morning at 3 a.m. and measured via oral thermometer at 100.4 orally, not 104.  Today also with frontal headache.   No nausea, vomiting, stomachache or diarrhea. Her three children have had similar symptoms.  Two of them diagnosed with Influenza Type B.  2.  GERD:  States last took Famotidine beginning of January, but is no longer having symptoms.    3.  Microscopic hematuria:  Has not been able to get set up with a new orange card, so have not been able to complete referral for [facility] orange card.  Went over a work around for her employer as he will not write a letter for her regarding income.   Current Meds  Medication Sig  . albuterol (PROVENTIL HFA;VENTOLIN HFA) 108 (90 Base) MCG/ACT inhaler Inhale 1-2 puffs into the lungs every 6 (six) hours as needed for wheezing or shortness of breath.  . cetirizine (ZYRTEC) 10 MG tablet Take 10 mg by mouth daily.  . fluticasone (FLOVENT HFA) 110 MCG/ACT inhaler 1 puff twice daily followed by brushing teeth and tongue  . ibuprofen (ADVIL,MOTRIN) 200 MG tablet 2-4 pastillas cada 6 horas a necesita dolor de garganta siempre con comida   No Known Allergies   Review of Systems    Objective:   BP 132/84 (BP Location: Left Arm, Patient Position: Sitting, Cuff Size: Normal)   Pulse 74   Temp 99.2 F (37.3 C) (Oral)   Resp 12   Ht 5' (1.524 m)   Wt 173 lb (78.5 kg)   LMP  (LMP Unknown)   SpO2 98%   BMI 33.79 kg/m   Physical Exam   Perspiring, mildly ill appearing HEENT:  PERRL, EOMI, Nasal mucosa swollen and red.  Throat with mild injection, No exudate.  MMM Neck:  Supple, No adenopathy Chest:  CTA CV:  RRR without murmur or rub.  Radial pulses normal and equal Abd:  S,  NT, No HSM or mass, + BS. Skin:  No rash    Assessment & Plan   1.  Likely influenza:  Her husband, who has never been seen here had a sore throat this morning. Discussed he needs to leave work and get started on Tamiflu as well. Do not know if we have openings today, if not, he needs to go to Urgent Care and get seen for medication Push fluids Ibuprofen for symptomatic relief Tamiflu 75 mg twice daily for 5 days.  2.  GERD:  Resolved and off meds now without problem  3.  Microscopic hematuria:  Continues.  Discussed how she can get set up with orange card for Urology referral.

## 2018-10-20 NOTE — Patient Instructions (Addendum)
Tome mucho agua  Ibuprofen 2-4 pastillas cada 6 horas a necesita.  Siempre con comida

## 2018-11-16 ENCOUNTER — Other Ambulatory Visit: Payer: Self-pay

## 2018-11-16 DIAGNOSIS — E010 Iodine-deficiency related diffuse (endemic) goiter: Secondary | ICD-10-CM

## 2018-11-17 LAB — T4, FREE: Free T4: 0.88 ng/dL (ref 0.82–1.77)

## 2018-11-17 LAB — TSH: TSH: 4.62 u[IU]/mL — ABNORMAL HIGH (ref 0.450–4.500)

## 2019-03-09 ENCOUNTER — Other Ambulatory Visit: Payer: Self-pay

## 2019-03-09 DIAGNOSIS — R7989 Other specified abnormal findings of blood chemistry: Secondary | ICD-10-CM

## 2019-03-10 LAB — T4, FREE: Free T4: 0.81 ng/dL — ABNORMAL LOW (ref 0.82–1.77)

## 2019-03-10 LAB — TSH: TSH: 6.14 u[IU]/mL — ABNORMAL HIGH (ref 0.450–4.500)

## 2019-03-22 MED ORDER — LEVOTHYROXINE SODIUM 25 MCG PO TABS: 25.0000 ug | ORAL_TABLET | Freq: Every day | ORAL | 11 refills | Status: DC

## 2019-03-22 NOTE — Progress Notes (Signed)
See addition of levothyroxine

## 2019-05-03 ENCOUNTER — Other Ambulatory Visit: Payer: Self-pay

## 2019-05-05 ENCOUNTER — Other Ambulatory Visit: Payer: Self-pay

## 2019-05-05 DIAGNOSIS — E010 Iodine-deficiency related diffuse (endemic) goiter: Secondary | ICD-10-CM

## 2019-05-06 LAB — TSH: TSH: 4.93 u[IU]/mL — ABNORMAL HIGH (ref 0.450–4.500)

## 2019-05-18 ENCOUNTER — Telehealth: Payer: Self-pay | Admitting: Internal Medicine

## 2019-05-18 NOTE — Telephone Encounter (Signed)
To Dr. Amil Amen to review CT results

## 2019-05-18 NOTE — Telephone Encounter (Signed)
Notify her kidneys and urinary system as well as everything else looks normal.  No stones in urinary system.   Nothing on her left side to suggest why she has pain there. Did the urologist get a copy of this?   Check with [facility] as to which Urologist saw her please. She does have fat in her liver so needs to work on a healthier diet and regular physical activity.

## 2019-05-18 NOTE — Telephone Encounter (Signed)
Patient called asking to get any information regarding CT results patient had done on 05/16/2019. Patient also states still having left pelvic side pain with frequency x two weeks with blood when urinate; first week noticed blood after cleaning on toilet paper and and the week after could see the blood in the toilet after urination "like blood with mucus" patient stated.  Please advise.

## 2019-05-18 NOTE — Telephone Encounter (Signed)
Patient scheduled to come tomorrow to give a urine sample at 11:00 am and informed will go from there.

## 2019-05-19 ENCOUNTER — Other Ambulatory Visit: Payer: Self-pay

## 2019-05-19 ENCOUNTER — Other Ambulatory Visit: Payer: Self-pay | Admitting: Internal Medicine

## 2019-05-19 DIAGNOSIS — Z87448 Personal history of other diseases of urinary system: Secondary | ICD-10-CM

## 2019-05-19 DIAGNOSIS — Z87898 Personal history of other specified conditions: Secondary | ICD-10-CM | POA: Insufficient documentation

## 2019-05-19 LAB — POCT URINALYSIS DIPSTICK
Bilirubin, UA: NEGATIVE
Glucose, UA: NEGATIVE
Ketones, UA: NEGATIVE
Leukocytes, UA: NEGATIVE
Nitrite, UA: NEGATIVE
Odor: NEGATIVE
Protein, UA: NEGATIVE
Spec Grav, UA: 1.01 (ref 1.010–1.025)
Urobilinogen, UA: NEGATIVE E.U./dL — AB
pH, UA: 7.5 (ref 5.0–8.0)

## 2019-05-19 NOTE — Progress Notes (Signed)
Here for UA due to gross hematuria.  REcent CT showed no abnormality of kidneys and collecting system.  Awaiting Urologic consult UA with only trace blood today, rest normal

## 2019-05-19 NOTE — Telephone Encounter (Signed)
Patient notified.  Verbalized understanding. 

## 2019-05-19 NOTE — Telephone Encounter (Signed)
Spoke with patient when came to the clinic to gives urine sample; information was given and patient verbalized understanding.  Did the Urologist get a copy of this? Which Urologist saw patient?  Per D[place] Ar at [facility]: "I did send [facility] the imaging notes! I am just waiting on them to let me know of appointment. They wanted to review results first before they scheduled".

## 2019-05-19 NOTE — Telephone Encounter (Signed)
Notify urine only with a trace of blood--was very minimal

## 2019-06-21 ENCOUNTER — Other Ambulatory Visit: Payer: Self-pay

## 2019-06-23 ENCOUNTER — Other Ambulatory Visit: Payer: Self-pay

## 2019-06-23 DIAGNOSIS — R7989 Other specified abnormal findings of blood chemistry: Secondary | ICD-10-CM

## 2019-06-24 LAB — TSH: TSH: 1.75 u[IU]/mL (ref 0.450–4.500)

## 2019-08-04 ENCOUNTER — Telehealth: Payer: Self-pay | Admitting: Internal Medicine

## 2019-08-04 ENCOUNTER — Other Ambulatory Visit: Payer: Self-pay

## 2019-08-04 MED ORDER — LEVOTHYROXINE SODIUM 50 MCG PO TABS: 50.0000 ug | ORAL_TABLET | Freq: Every day | ORAL | 11 refills | Status: DC

## 2019-08-04 NOTE — Telephone Encounter (Signed)
Patient requesting authorization sent to the Pharmacy for more refills on levothyroxine due to back in August patient told  to increase it to 50 mcg Daily.   Please advise.

## 2019-08-04 NOTE — Telephone Encounter (Signed)
New Rx for Levothyroxine 50 mcg sent to [facility] on [address] [facility]. Please notify patient

## 2019-08-04 NOTE — Telephone Encounter (Signed)
Patient notified

## 2019-10-11 ENCOUNTER — Other Ambulatory Visit: Payer: Self-pay | Admitting: Internal Medicine

## 2019-11-07 IMAGING — US US PELVIS COMPLETE TRANSABD/TRANSVAG
1 series · 13 of 25 positions shown · non-contrast
Comparison: Prior ultrasound from 12/31/2013.

CLINICAL DATA: Initial evaluation for left lower quadrant pelvic
pain for 6 months.

EXAM:
TRANSABDOMINAL AND TRANSVAGINAL ULTRASOUND OF PELVIS
TECHNIQUE: Both transabdominal and transvaginal ultrasound examinations of the
pelvis were performed. Transabdominal technique was performed for
global imaging of the pelvis including uterus, ovaries, adnexal
regions, and pelvic cul-de-sac. It was necessary to proceed with
endovaginal exam following the transabdominal exam to visualize the
uterus and ovaries.

[Series 1: us pelvis complete transabd/transvag · 0.22mm/px · 13 of 67 slices shown]
[im 1/67]
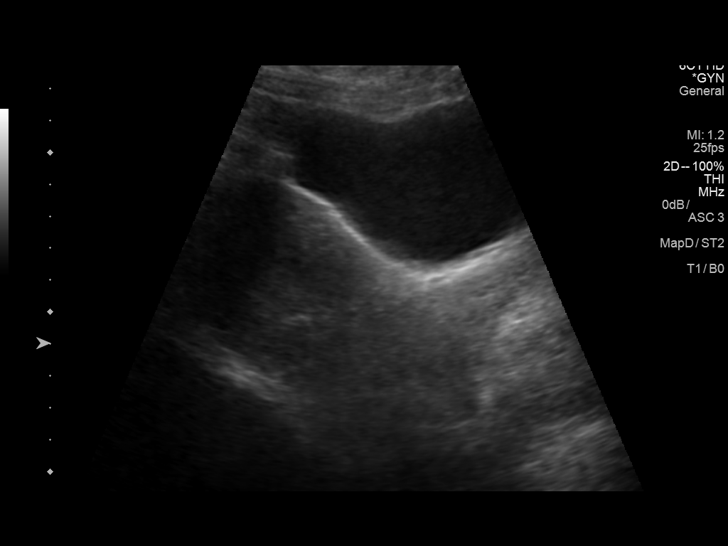
[im 6/67]
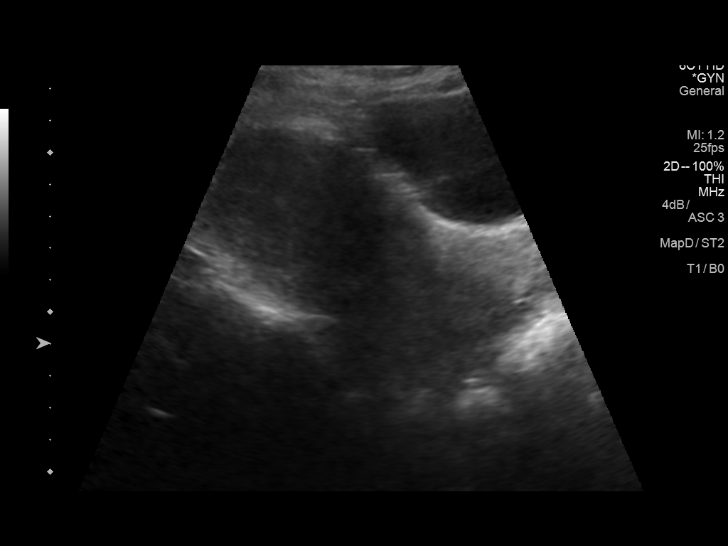
[im 12/67]
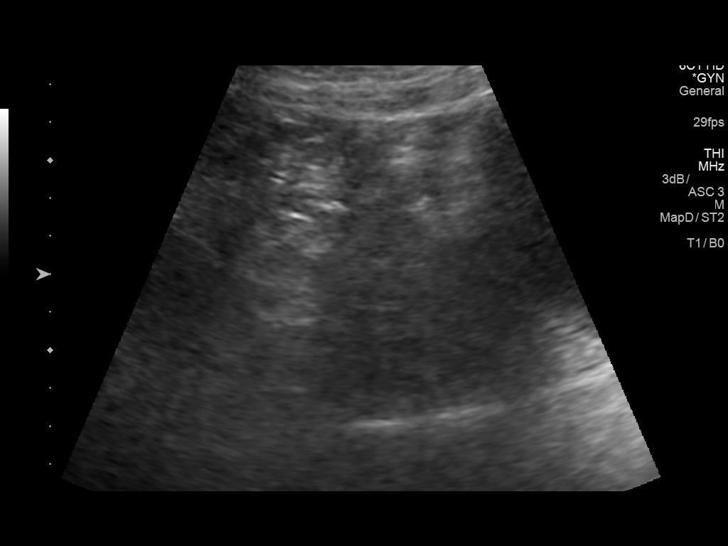
[im 17/67]
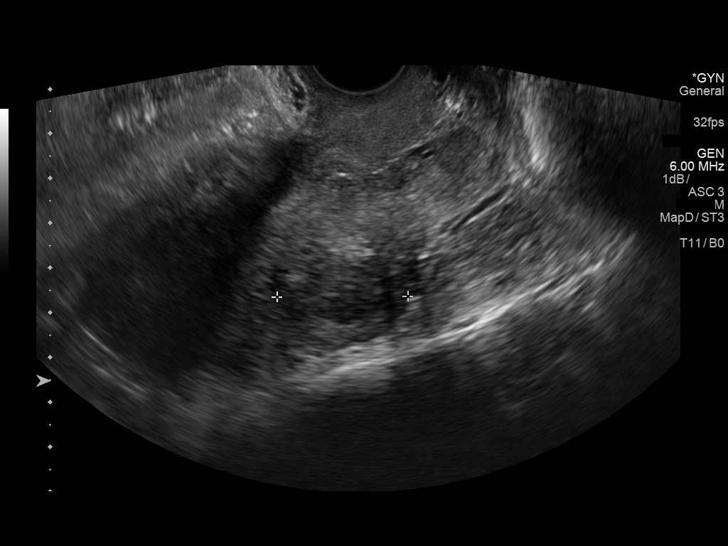
[im 23/67]
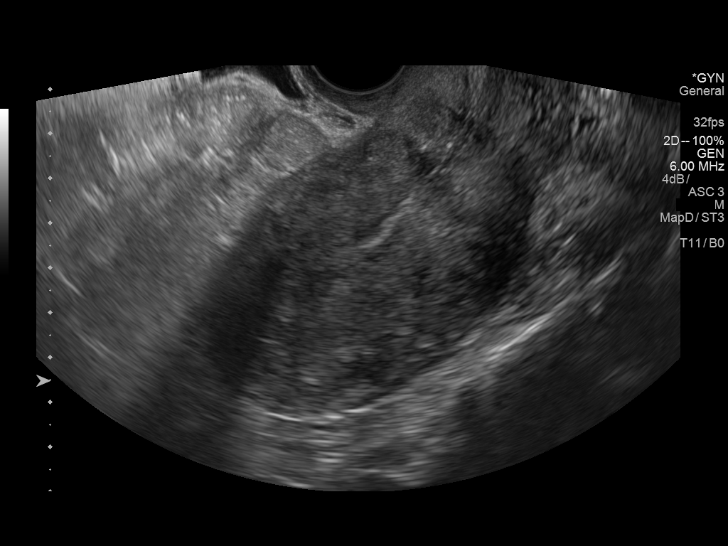
[im 28/67]
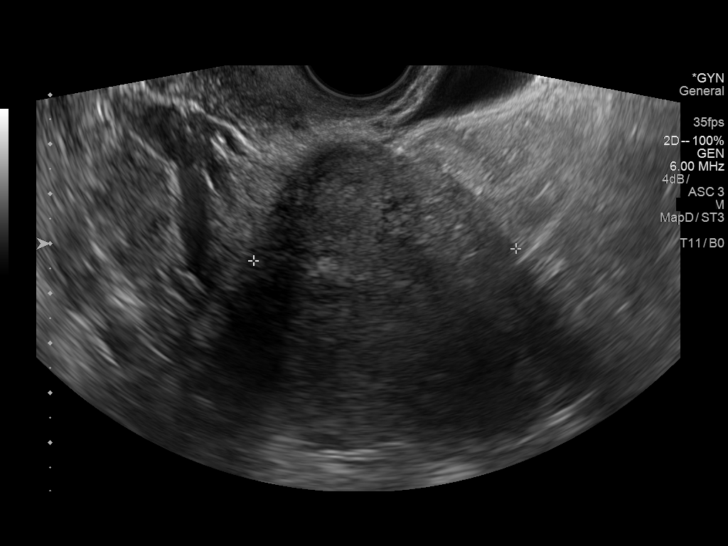
[im 34/67]
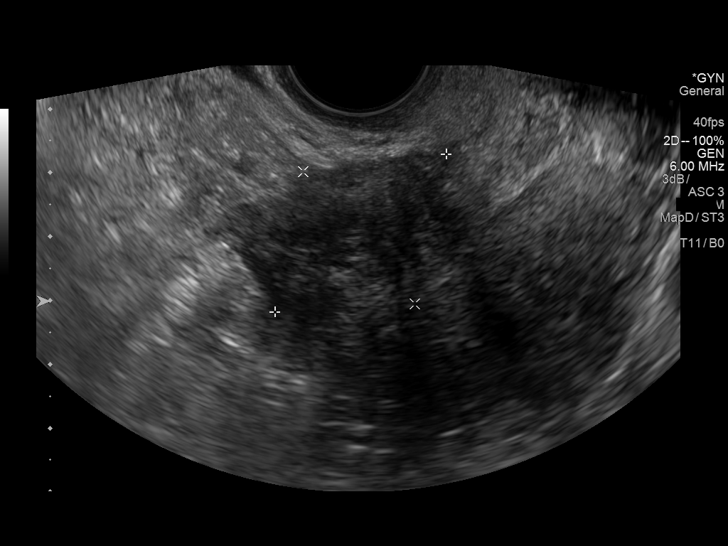
[im 39/67]
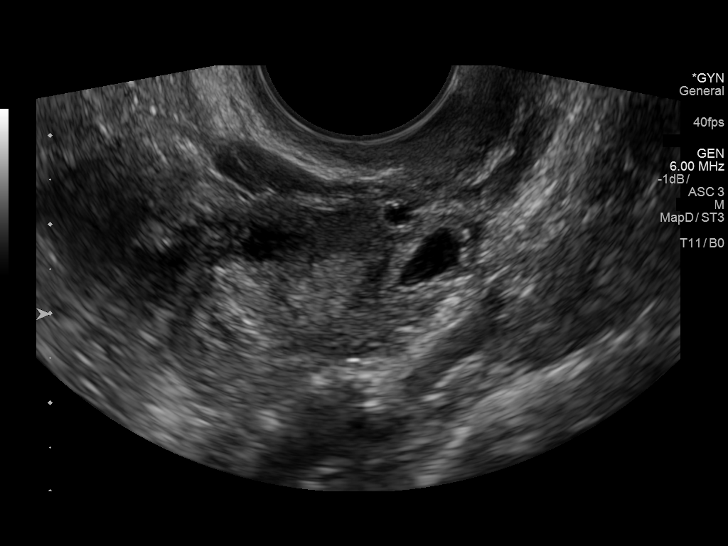
[im 45/67]
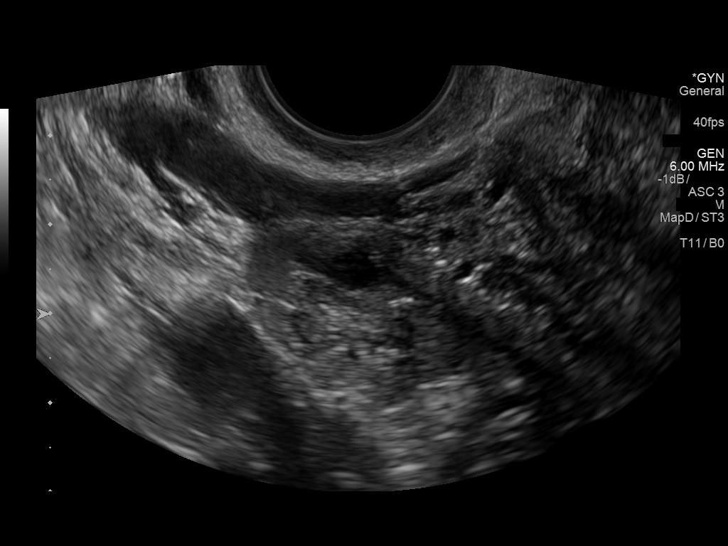
[im 50/67]
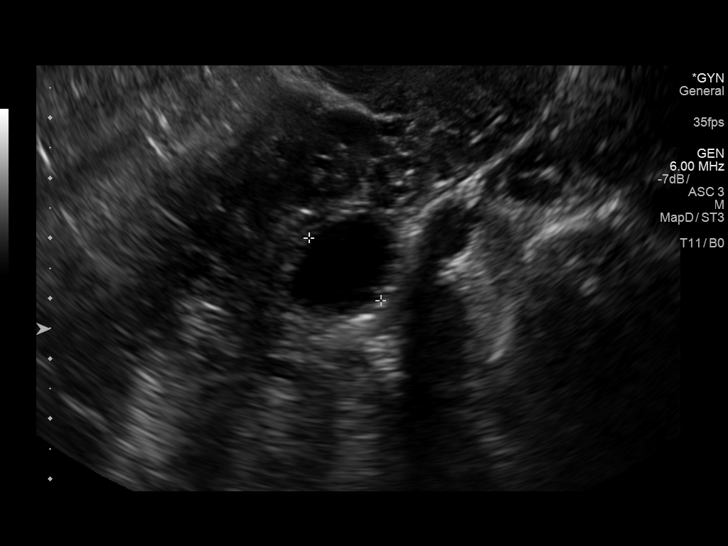
[im 56/67]
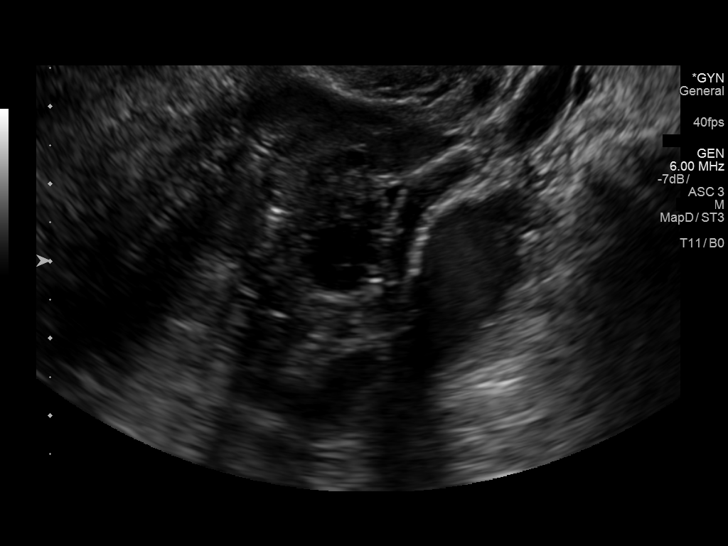
[im 61/67]
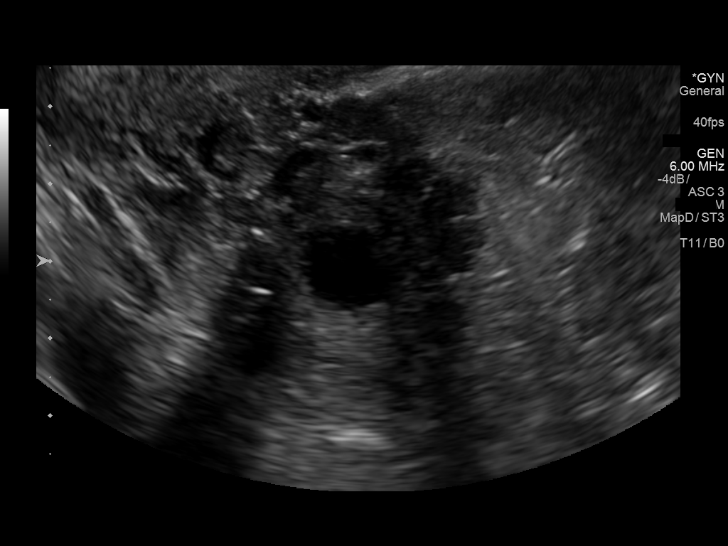
[im 67/67]
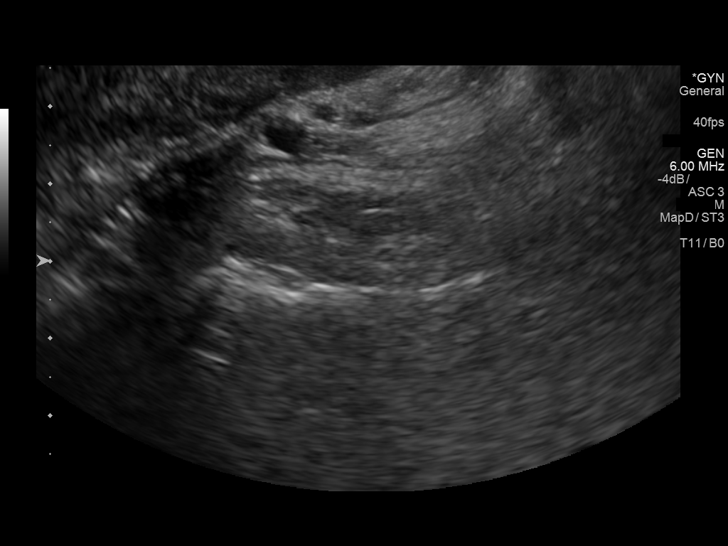

[13 of 25 positions shown; findings below may reference images not displayed]

FINDINGS: Uterus

Measurements: 10.5 x 5.0 x 5.6 cm. 2.9 x 2.4 x 3.0 cm subserosal
fibroid present within the posterior lower uterine segment.

Endometrium

Thickness: 11.9 mm.  No focal abnormality visualized.

Right ovary

Measurements: 3.7 x 2.7 x 1.9 cm. Normal appearance/no adnexal mass.

Left ovary

Measurements: 3.5 x 2.5 x 2.1 cm. Normal appearance/no adnexal mass.
Incidental note made of a 1.6 x 1.6 x 1.4 cm simple cyst, most
consistent with a normal physiologic cyst.

Other findings

No abnormal free fluid.

Please note that the images appear to be incorrectly labeled
staining patient age as 22 years old. This is incorrect, as date of
birth is 09/12/1971. Additionally, the patient's medical record
number is correct and same as previous studies.
IMPRESSION: 1. 3 cm subserosal fibroid within the posterior lower uterine
segment.
2. Otherwise normal pelvic ultrasound.  No adnexal mass.

## 2019-11-25 ENCOUNTER — Other Ambulatory Visit: Payer: Self-pay | Admitting: Internal Medicine

## 2019-11-28 ENCOUNTER — Telehealth: Payer: Self-pay | Admitting: Internal Medicine

## 2019-11-28 NOTE — Telephone Encounter (Signed)
Spoke with pt. Who stated has been taking 2 pills of the 25 mcg and yesterday picked up the 50 mcg ones. Appointments were scheduled as well.

## 2019-11-29 ENCOUNTER — Other Ambulatory Visit: Payer: Self-pay

## 2019-11-29 MED ORDER — LEVOTHYROXINE SODIUM 25 MCG PO TABS: ORAL_TABLET | ORAL | 5 refills | Status: DC

## 2019-12-19 ENCOUNTER — Other Ambulatory Visit: Payer: Self-pay

## 2019-12-19 DIAGNOSIS — R7989 Other specified abnormal findings of blood chemistry: Secondary | ICD-10-CM

## 2019-12-20 LAB — TSH: TSH: 2.4 u[IU]/mL (ref 0.450–4.500)

## 2019-12-22 ENCOUNTER — Ambulatory Visit: Payer: Self-pay | Admitting: Internal Medicine

## 2019-12-22 ENCOUNTER — Encounter: Payer: Self-pay | Admitting: Internal Medicine

## 2019-12-22 ENCOUNTER — Other Ambulatory Visit: Payer: Self-pay

## 2019-12-22 VITALS — BP 102/70 | HR 70 | Resp 12 | Ht 60.0 in | Wt 176.0 lb

## 2019-12-22 DIAGNOSIS — E039 Hypothyroidism, unspecified: Secondary | ICD-10-CM

## 2019-12-22 DIAGNOSIS — J45909 Unspecified asthma, uncomplicated: Secondary | ICD-10-CM

## 2019-12-22 DIAGNOSIS — E669 Obesity, unspecified: Secondary | ICD-10-CM

## 2019-12-22 DIAGNOSIS — Z6834 Body mass index (BMI) 34.0-34.9, adult: Secondary | ICD-10-CM

## 2019-12-22 NOTE — Patient Instructions (Signed)
NO TV in BEDROOM!!!!!!!!!

## 2019-12-22 NOTE — Progress Notes (Signed)
    Subjective:    Patient ID: Sarah Massey, female   DOB: [birth year], 49 y.o.   MRN: [id number]   HPI   Interpreted by her friend.   Hypothyroidism:  Good energy for most part.  Sometimes problems with sleep. Has gained 8 lbs of weight in past year.   She states she is using stationary bike for 1/2 hour and walking on treadmill for 1/2 hour indoors.  States has been doing since beginning of pandemic.  Recent TSH 2.400 Taking 50 mcg Euthyroid daily on empty stomach.  Sleep issues:  Cannot initiate sleep.  Husband has TV going all night and refuses to turn it off and go into another room. She also gets up and does busy work if cannot sleep. Does not get outside often Little caffeine intake Relatively regular bedtime and arise hours.    Not using her Flovent-states only uses when necessary.  Not using Zyrtec.  Has most problems in the spring with pollen  Current Meds  Medication Sig  . albuterol (PROVENTIL HFA;VENTOLIN HFA) 108 (90 Base) MCG/ACT inhaler Inhale 1-2 puffs into the lungs every 6 (six) hours as needed for wheezing or shortness of breath.  . cetirizine (ZYRTEC) 10 MG tablet Take 10 mg by mouth daily.  . fluticasone (FLOVENT HFA) 110 MCG/ACT inhaler 1 puff twice daily followed by brushing teeth and tongue  . levothyroxine (EUTHYROX) 50 MCG tablet Take 50 mcg by mouth daily before breakfast. 1 tab by mouth daily on empty stomach  . [DISCONTINUED] levothyroxine (EUTHYROX) 25 MCG tablet TAKE 1 TABLET BY MOUTH ONCE DAILY BEFORE BREAKFAST     No Known Allergies   Review of Systems    Objective:   BP 102/70 (BP Location: Left Arm, Patient Position: Sitting, Cuff Size: Normal)   Pulse 70   Resp 12   Ht 5' (1.524 m)   Wt 176 lb (79.8 kg)   LMP  (LMP Unknown)   BMI 34.37 kg/m   Physical Exam  NAD HEENT:  PERRL, EOMI, conjunctivae without injection.   Neck:  Supple, No adenopathy, no thyromegaly Chest:  Occasional end expiratory wheeze, left more  so than right and posteriorly. CV:  RRR without murmur or rub.  Radial pulses normal and equal LE:  No edema   Assessment & Plan   1.  Hypothyroidism:  To take the 50 mcg tabs and not the 25 mcg.  Do not feel the 50 dose is causing her insomnia (patient brought this up as a concern)  2.  Insomnia:  TV out of bedroom.  Note to husband about this.  To get outside with physical activities.  3.  Allergies and asthma:  To get started on Flovent and Zytrec as pollen count is high and she is wheezing mildly.  Discussed should get started every spring when she sees trees blooming and use regularly until her allergy season/pollen down.

## 2019-12-25 ENCOUNTER — Encounter: Payer: Self-pay | Admitting: Internal Medicine

## 2020-05-01 ENCOUNTER — Encounter: Payer: Self-pay | Admitting: Internal Medicine

## 2020-08-22 ENCOUNTER — Other Ambulatory Visit: Payer: Self-pay

## 2020-08-22 ENCOUNTER — Ambulatory Visit (INDEPENDENT_AMBULATORY_CARE_PROVIDER_SITE_OTHER): Payer: Self-pay | Admitting: Internal Medicine

## 2020-08-22 ENCOUNTER — Encounter: Payer: Self-pay | Admitting: Internal Medicine

## 2020-08-22 VITALS — BP 130/78 | HR 76 | Resp 12 | Ht 60.0 in | Wt 185.0 lb

## 2020-08-22 DIAGNOSIS — Z1231 Encounter for screening mammogram for malignant neoplasm of breast: Secondary | ICD-10-CM

## 2020-08-22 DIAGNOSIS — K219 Gastro-esophageal reflux disease without esophagitis: Secondary | ICD-10-CM

## 2020-08-22 DIAGNOSIS — E039 Hypothyroidism, unspecified: Secondary | ICD-10-CM

## 2020-08-22 DIAGNOSIS — Z87898 Personal history of other specified conditions: Secondary | ICD-10-CM

## 2020-08-22 DIAGNOSIS — E669 Obesity, unspecified: Secondary | ICD-10-CM

## 2020-08-22 DIAGNOSIS — E66811 Obesity, class 1: Secondary | ICD-10-CM

## 2020-08-22 DIAGNOSIS — Z6834 Body mass index (BMI) 34.0-34.9, adult: Secondary | ICD-10-CM

## 2020-08-22 DIAGNOSIS — Z0000 Encounter for general adult medical examination without abnormal findings: Secondary | ICD-10-CM

## 2020-08-22 MED ORDER — FAMOTIDINE 20 MG PO TABS: ORAL_TABLET | ORAL | 4 refills | Status: DC

## 2020-08-22 NOTE — Progress Notes (Signed)
Subjective:    Patient ID: Sarah Massey, female   DOB: 1971/06/08, 49 y.o.   MRN: [id number]   HPI   Rush Barer interprets  CPE without pap  1.  Pap:  Last 01/2018 and normal.  2.  Mammogram:  Last mammogram 03/02/2018.  No family history of breast cancer.    3.  Osteoprevention:  Has 3 servings of dairy daily.  Milk, cheese and yogurt.  Walks 1 hour to park every day.  4.  Guaiac Cards:  Performed in 2019 and negative    5.  Colonoscopy:  Never.  No family history of colon cancer.  6.  Immunizations:  Refuses influenza vaccine as she gets a fever and her arm hurts. Needs Td as well COVID vaccination up to date   19.  Glucose/Cholesterol:  Old A1C from 2011 found with result of 6.3%.  Blood glucose since with occasional mild elevation.  Cholesterol borderline with last check in June of 2019.  Current Meds  Medication Sig  . albuterol (PROVENTIL HFA;VENTOLIN HFA) 108 (90 Base) MCG/ACT inhaler Inhale 1-2 puffs into the lungs every 6 (six) hours as needed for wheezing or shortness of breath.  . cetirizine (ZYRTEC) 10 MG tablet Take 10 mg by mouth daily.  . fluticasone (FLOVENT HFA) 110 MCG/ACT inhaler 1 puff twice daily followed by brushing teeth and tongue  . levothyroxine (SYNTHROID) 50 MCG tablet Take 50 mcg by mouth daily before breakfast. 1 tab by mouth daily on empty stomach   No Known Allergies  Past Medical History:  Diagnosis Date  . Asthma   . Asthma 01/25/2012  . Depression    pt is depressed about her current pregnancy  . Fibroid   . Gestational diabetes    previous pregnancy  . Hypothyroidism   . Obesity   . PONV (postoperative nausea and vomiting)   . Urinary tract infection     Past Surgical History:  Procedure Laterality Date  . CESAREAN SECTION  2005  . DILATION AND CURETTAGE OF UTERUS  2007    Family History  Problem Relation Age of Onset  . Thyroid disease Father   . Hypertension Mother   . Diabetes Mother   .  Heart disease Mother        MI cause of death  . Fibroids Sister   . Thyroid disease Sister   . Early death Maternal Grandfather   . Thyroid disease Sister   . GER disease Son   . Anesthesia problems Neg Hx     Social History   Socioeconomic History  . Marital status: Married    Spouse name: Sonia Baller  . Number of children: 6  . Years of education: 3  . Highest education level: 3rd grade  Occupational History  . Occupation: Housemaker  Tobacco Use  . Smoking status: Never Smoker  . Smokeless tobacco: Never Used  Vaping Use  . Vaping Use: Never used  Substance and Sexual Activity  . Alcohol use: No  . Drug use: No  . Sexual activity: Yes    Birth control/protection: Condom    Comment: desires nexplanon  Other Topics Concern  . Not on file  Social History Narrative   Her 3 oldest children live on their own with their partners   She lives at home with her 3 youngest children and her husband      Social Determinants of Health   Financial Resource Strain: [place]   . Difficulty of Paying  Living Expenses: Not hard at all  Food Insecurity: No Food Insecurity  . Worried About Charity fundraiser in the Last Year: Never true  . Ran Out of Food in the Last Year: Never true  Transportation Needs: No Transportation Needs  . Lack of Transportation (Medical): No  . Lack of Transportation (Non-Medical): No  Physical Activity: Not on file  Stress: Not on file  Social Connections: Not on file  Intimate Partner Violence: Not At Risk  . Fear of Current or Ex-Partner: No  . Emotionally Abused: No  . Physically Abused: No  . Sexually Abused: No     Review of Systems  HENT: Positive for sore throat (In afternoons, she has a sore throat and gets a hoarse voice.  For 2 months--the acid in mouth in the morning for 2 months as well.).   Gastrointestinal: Negative for blood in stool and constipation.       Does have an acid taste in her mouth every morning--can be bad.  No  heartburn. Eats a lot of onions and tomatoes.   One coffee in the morning. No chocolate, tobacco or alcohol. Does not lie down after eating.   Has put on 18 lbs for past 2 years. She feels this is because she no longer performs Zumba  Psychiatric/Behavioral:       More stressed recently.  Stressed as needs to go out more with her kids in school and afraid they will get covid.      Objective:   BP 130/78 (BP Location: Left Arm, Patient Position: Sitting, Cuff Size: Normal)   Pulse 76   Resp 12   Ht 5' (1.524 m)   Wt 185 lb (83.9 kg)   BMI 36.13 kg/m   Physical Exam Constitutional:      Appearance: Normal appearance.  HENT:     Head: Normocephalic and atraumatic.     Right Ear: Tympanic membrane, ear canal and external ear normal.     Left Ear: Tympanic membrane, ear canal and external ear normal.     Nose: Nose normal.     Mouth/Throat:     Mouth: Mucous membranes are moist.     Pharynx: Oropharynx is clear.  Eyes:     Extraocular Movements: Extraocular movements intact.     Conjunctiva/sclera: Conjunctivae normal.     Pupils: Pupils are equal, round, and reactive to light.     Funduscopic exam:    Right eye: Red reflex present.        Left eye: Red reflex present.    Comments: Discs sharp bilaterally.  Cardiovascular:     Rate and Rhythm: Normal rate and regular rhythm.     Heart sounds: S1 normal and S2 normal. No murmur heard. No friction rub. No S3 or S4 sounds.      Comments: No carotid bruits.  Carotid, radial, femoral, DP and PT pulses normal and equal.  Pulmonary:     Effort: Pulmonary effort is normal.     Breath sounds: Normal breath sounds.  Chest:  Breasts:     Right: No mass, nipple discharge, skin change, axillary adenopathy or supraclavicular adenopathy.     Left: No mass, nipple discharge, skin change, axillary adenopathy or supraclavicular adenopathy.    Abdominal:     General: Bowel sounds are normal.     Palpations: Abdomen is soft. There is  no hepatomegaly, splenomegaly or mass.     Tenderness: There is no abdominal tenderness.     Hernia: No  hernia is present.  Genitourinary:    Comments: Normal external female genitalia.  Not uterine or adnexal mass or tenderness. Musculoskeletal:        General: Normal range of motion.     Cervical back: Normal range of motion and neck supple.     Right lower leg: No edema.     Left lower leg: No edema.  Lymphadenopathy:     Head:     Right side of head: No submental or submandibular adenopathy.     Left side of head: No submental or submandibular adenopathy.     Cervical: No cervical adenopathy.     Upper Body:     Right upper body: No supraclavicular or axillary adenopathy.     Left upper body: No supraclavicular or axillary adenopathy.     Lower Body: No right inguinal adenopathy. No left inguinal adenopathy.  Skin:    General: Skin is warm.     Capillary Refill: Capillary refill takes less than 2 seconds.     Findings: No rash.  Neurological:     Mental Status: She is alert.     Cranial Nerves: Cranial nerves are intact.     Sensory: Sensation is intact.     Motor: Motor function is intact.     Coordination: Coordination is intact.     Gait: Gait is intact.     Deep Tendon Reflexes: Reflexes are normal and symmetric.  Psychiatric:        Mood and Affect: Mood normal.        Behavior: Behavior normal.        Thought Content: Thought content normal.        Judgment: Judgment normal.      Assessment & Plan   1.  CPE without pap Schedule mammogram. Refuses influenza vaccine Follow up in 4 weeks for fasting labs:  CBC, BMP, Hep C, FLP and Td Guaiac cards x 3 to return when in for labs--start no sooner than 2 weeks before coming in.  2.  Weight gain/obesity:  To work on lifestyle changes--discussed  3.  Would like [place] to speak with husband--diagnosed with herpes 16 years ago, but not sure he was ever tested and they continue to use condoms--would like to be tested to be  sure and consider suppression instead.  4.  GERD:  GERD precautions and start Famotidine 40 mg at bedtime.  5.  Hypothyroidism:  TSH with fasting labs  6.  Hx of prediabetes:  A1C with fasting labs

## 2020-08-22 NOTE — Patient Instructions (Addendum)
Enfermedad de reflujo gastroesofgico en los adultos Gastroesophageal Reflux Disease, Adult El reflujo gastroesofgico (RGE) ocurre cuando el cido del estmago sube por el tubo que conecta la boca con el estmago (esfago). Normalmente, la comida baja por el esfago y se mantiene en el estmago, donde se la digiere. Sin embargo, cuando una persona tiene [place], los alimentos y el cido estomacal suelen volver al esfago. Si esto se vuelve un problema ms grave, a la persona se le puede diagnosticar una enfermedad llamada enfermedad de reflujo gastroesofgico (ERGE). La ERGE ocurre cuando el reflujo:  Sucede a menudo.  Causa sntomas frecuentes o graves.  Causa problemas tales como dao en el esfago. Cuando el cido del Insurance claims handler en contacto con el esfago, el cido puede provocar dolor (inflamacin) en el esfago. Con el tiempo, pueden formarse pequeos agujeros (lceras) en el revestimiento del esfago. Cules son las causas? Esta afeccin se debe a un problema en el msculo que se encuentra entre el esfago y Product manager (esfnter esofgico inferior, o EEI). Normalmente, el EEI se cierra una vez que la comida pasa a travs del esfago hasta el [place]. Cuando el EEI se encuentra debilitado o tiene alguna anomala, no se cierra por completo, y eso permite que tanto la comida como el jugo gstrico, que es cido, [place] a subir por el esfago. El EEI puede debilitarse a causa de ciertas sustancias alimenticias, medicamentos y Product/process development scientist, que incluyen:  El consumo de [place].  [place].  Tener una hernia de hiato.  Consumo de alcohol.  Ciertos alimentos y bebidas, como caf, chocolate, cebollas y [place]. Qu incrementa el riesgo? Es ms probable que tenga esta afeccin si:  Tiene un aumento del Engineer, site.  Tiene un trastorno del tejido conjuntivo.  [place] antiinflamatorios no esteroideos (AINE). Cules son los signos o los sntomas? Los sntomas de esta afeccin  incluyen:  Acidez estomacal.  Dificultad o dolor al tragar.  Sensacin de Best boy un bulto en la garganta.  Sabor amargo en la boca.  Mal aliento.  Gran cantidad de saliva.  Estmago inflamado o con Tree surgeon.  Eructos.  Dolor en el pecho. El dolor de pecho puede deberse a distintas afecciones. Es importante que consulte al mdico si tiene dolor de [place].  Dificultad para respirar o sibilancias.  Tos constante (crnica) o tos nocturna.  Desgaste del Paramedic.  Prdida de peso. Cmo se diagnostica? El mdico le har una historia clnica y un examen fsico. Para determinar si tiene ERGE leve o grave, el mdico tambin puede controlar cmo usted reacciona al tratamiento. Tambin pueden [employer], que [place] los siguientes:  Un estudio para examinarle el [place] y el esfago con una cmara pequea (endoscopa).  Una prueba para medir el grado de Musician.  Una prueba para medir cunta presin hay en el esfago.  Un estudio de deglucin con bario comn o modificado para ver la forma, el tamao y el funcionamiento del esfago. Cmo se trata? El [place] del tratamiento es ayudar a Public house manager los sntomas y Product/process development scientist las complicaciones. El tratamiento de esta afeccin puede variar segn la gravedad de los sntomas. El mdico puede recomendarle lo siguiente:  Cambios en la dieta.  Medicamentos.  Clementeen Hoof. Siga estas indicaciones en su casa: Comida y bebida   Siga la dieta recomendada por el mdico. Esto puede incluir evitar ciertos alimentos y bebidas, por ejemplo: ? Caf y t (con o sin cafena). ? Bebidas que contengan alcohol. ? Bebidas energticas y deportivas. ? Bebidas gaseosas o  refrescos. ? Chocolate y cacao. ? Menta y [place]. ? Ajo y cebolla. ? Rbano picante. ? Alimentos condimentados, picantes y cidos, por ejemplo, todos los tipos de pimientas, [place] en polvo, curry en polvo, vinagre, salsas picantes y Omnicom. ? Ctricos y sus jugos, por ejemplo, naranjas, limones y limas. ? Alimentos a base de tomate, como salsa de [place], [place], salsa picante y pizza con salsa de [place]. ? Alimentos fritos y [place], [place] donas, papas fritas y aderezos ricos en grasas. ? Carnes con alto contenido de grasa, como salchichas, y cortes de carnes rojas y blancas con mucha grasa, por ejemplo, chuletas o costillas, embutidos, jamn y tocino. ? Productos lcteos ricos en grasas, como leche [place], [place] y [place] crema.  Haga comidas pequeas y frecuentes Medical sales representative de comidas abundantes.  Evite beber grandes cantidades de lquidos con las comidas.  Evite comer 2 o 3horas antes de acostarse.  Evite recostarse inmediatamente despus de comer.  No haga ejercicios enseguida despus de comer. Estilo de vida   No consuma ningn producto que contenga nicotina o tabaco, como cigarrillos, cigarrillos electrnicos y tabaco de Higher education careers adviser. Si necesita ayuda para dejar de fumar, consulte al [employer].  Trate de reducir el estrs con mtodos como el yoga o la meditacin. Si necesita ayuda para reducir [employer] de estrs, consulte al mdico.  Si tiene sobrepeso, baje hasta llegar a un peso saludable para usted. Pdale consejos al mdico para bajar de peso de [place] segura. Indicaciones generales  Est atento a cualquier cambio en los sntomas.  Tome los medicamentos de venta libre y los recetados solamente como se lo haya indicado el mdico. No tome aspirina, ibuprofeno ni otros AINE a menos que el mdico se lo indique.  Use ropa holgada. No use nada apretado alrededor de la cintura que haga presin sobre el abdomen.  Levante (eleve) la cabecera de la cama aproximadamente 6pulgadas (15cm).  Evite inclinarse si al hacerlo empeoran los sntomas.  Concurra a todas las visitas de seguimiento como se lo haya indicado el mdico. Esto es importante. Comunquese con un mdico si:  Tiene los siguientes  sntomas: ? Sntomas nuevos. ? Prdida de peso sin causa aparente. ? Dificultad o dolor al tragar. ? Sibilancias o una tos persistente. ? Voz ronca.  Los sntomas no mejoran con Dispensing optician. Solicite ayuda inmediatamente si:  [employer], el cuello, la [place], los dientes o la espalda.  Se siente transpirado, mareado o tiene una sensacin de desvanecimiento.  Siente dolor intenso en el pecho o le falta el aire.  Vomita y el vmito tiene un aspecto similar a la sangre o a los posos de caf.  Se desmaya.  Tiene heces sanguinolentas o negras.  No puede tragar, beber o comer. Resumen  El reflujo gastroesofgico ocurre cuando el cido del estmago sube al esfago. La ERGE es una enfermedad en la que el reflujo ocurre con frecuencia, causa sntomas frecuentes o graves, o causa problemas tales como dao en el esfago.  El tratamiento de esta afeccin puede variar segn la gravedad de los sntomas. El mdico puede indicarle que siga una dieta y haga cambios en su estilo de vida, tome medicamentos o se someta a [place].  Comunquese con un mdico si tiene sntomas nuevos o los sntomas empeoran.  Tome los medicamentos de venta libre y los recetados solamente como se lo haya indicado el mdico. No tome aspirina, ibuprofeno ni otros AINE a menos que el  mdico se lo indique.  Concurra a todas las visitas de seguimiento como se lo haya indicado el mdico. Esto es importante. Esta informacin no tiene Marine scientist el consejo del mdico. Asegrese de hacerle al mdico cualquier pregunta que tenga. Document Revised: 04/14/2018 Document Reviewed: 04/14/2018 Elsevier Patient Education  2020 [place] un vaso de agua antes de cada comida [place]ome un minimo de 6 a 8 vasos de agua diarios Coma tres veces al dia Coma Ardelia Mems proteina y Ardelia Mems grasa saludable con comida.  (huevos, pescado, pollo, pavo, y limite carnes rojas Coma 5 porciones diarias de legumbres.   Mezcle los colores Coma 2 porciones diarias de frutas con cascara cuando sea comestible Use platos pequeos Suelte su tenedor o cuchara despues de cada mordida hata que se mastique y se trague Come en la mesa con amigos o familiares por lo menos una vez al dia [place] la televisin y aparatos electrnicos durante la comida  Su objetivo debe ser perder una libra por semana  Estudios recientes indican que las personas quienes consumen todos de sus calorias durante 12 horas se bajan de pesocon Mas eficiencia.  Por ejemplo, si Usted come su primera comida a las 7:00 a.m., su comida final del dia se debe completar antes de las 7:00 p.m.

## 2020-09-23 ENCOUNTER — Other Ambulatory Visit: Payer: Self-pay

## 2020-09-23 ENCOUNTER — Other Ambulatory Visit: Payer: Self-pay | Admitting: Internal Medicine

## 2020-09-23 DIAGNOSIS — Z87898 Personal history of other specified conditions: Secondary | ICD-10-CM

## 2020-09-23 DIAGNOSIS — Z130 Encounter for screening for diseases of the blood and blood-forming organs and certain disorders involving the immune mechanism: Secondary | ICD-10-CM

## 2020-09-23 DIAGNOSIS — E039 Hypothyroidism, unspecified: Secondary | ICD-10-CM

## 2020-09-23 DIAGNOSIS — Z1159 Encounter for screening for other viral diseases: Secondary | ICD-10-CM

## 2020-09-23 DIAGNOSIS — Z23 Encounter for immunization: Secondary | ICD-10-CM

## 2020-09-23 NOTE — Progress Notes (Signed)
Here for Td FAsting labs:  FLP, CBC, BMP, A1C, TSH

## 2020-09-24 LAB — CBC WITH DIFFERENTIAL/PLATELET
Basophils Absolute: 0 10*3/uL (ref 0.0–0.2)
Basos: 1 %
EOS (ABSOLUTE): 0.2 10*3/uL (ref 0.0–0.4)
Eos: 3 %
Hematocrit: 44.4 % (ref 34.0–46.6)
Hemoglobin: 14.7 g/dL (ref 11.1–15.9)
Immature Grans (Abs): 0 10*3/uL (ref 0.0–0.1)
Immature Granulocytes: 0 %
Lymphocytes Absolute: 3.3 10*3/uL — ABNORMAL HIGH (ref 0.7–3.1)
Lymphs: 54 %
MCH: 29.9 pg (ref 26.6–33.0)
MCHC: 33.1 g/dL (ref 31.5–35.7)
MCV: 90 fL (ref 79–97)
Monocytes Absolute: 0.3 10*3/uL (ref 0.1–0.9)
Monocytes: 5 %
Neutrophils Absolute: 2.2 10*3/uL (ref 1.4–7.0)
Neutrophils: 37 %
Platelets: 227 10*3/uL (ref 150–450)
RBC: 4.91 x10E6/uL (ref 3.77–5.28)
RDW: 13.5 % (ref 11.7–15.4)
WBC: 6.1 10*3/uL (ref 3.4–10.8)

## 2020-09-24 LAB — LIPID PANEL W/O CHOL/HDL RATIO
Cholesterol, Total: 259 mg/dL — ABNORMAL HIGH (ref 100–199)
HDL: 41 mg/dL (ref >39)
LDL Chol Calc (NIH): 180 mg/dL — ABNORMAL HIGH (ref 0–99)
Triglycerides: 201 mg/dL — ABNORMAL HIGH (ref 0–149)
VLDL Cholesterol Cal: 38 mg/dL (ref 5–40)

## 2020-09-24 LAB — BASIC METABOLIC PANEL
BUN/Creatinine Ratio: 29 — ABNORMAL HIGH (ref 9–23)
BUN: 14 mg/dL (ref 6–24)
CO2: 25 mmol/L (ref 20–29)
Calcium: 9.8 mg/dL (ref 8.7–10.2)
Chloride: 99 mmol/L (ref 96–106)
Creatinine, Ser: 0.49 mg/dL — ABNORMAL LOW (ref 0.57–1.00)
GFR calc Af Amer: 131 mL/min/{1.73_m2} (ref >59)
GFR calc non Af Amer: 114 mL/min/{1.73_m2} (ref >59)
Glucose: 120 mg/dL — ABNORMAL HIGH (ref 65–99)
Potassium: 4.9 mmol/L (ref 3.5–5.2)
Sodium: 139 mmol/L (ref 134–144)

## 2020-09-24 LAB — HEPATITIS C ANTIBODY: Hep C Virus Ab: <0.1 s/co ratio (ref 0.0–0.9)

## 2020-09-24 LAB — HGB A1C W/O EAG: Hgb A1c MFr Bld: 7.2 % — ABNORMAL HIGH (ref 4.8–5.6)

## 2020-09-24 LAB — TSH: TSH: 4.25 u[IU]/mL (ref 0.450–4.500)

## 2020-10-03 ENCOUNTER — Ambulatory Visit: Payer: Self-pay | Admitting: Internal Medicine

## 2020-10-03 ENCOUNTER — Other Ambulatory Visit (INDEPENDENT_AMBULATORY_CARE_PROVIDER_SITE_OTHER): Payer: Self-pay | Admitting: Internal Medicine

## 2020-10-03 ENCOUNTER — Encounter: Payer: Self-pay | Admitting: Internal Medicine

## 2020-10-03 ENCOUNTER — Other Ambulatory Visit: Payer: Self-pay

## 2020-10-03 VITALS — BP 130/83 | HR 74 | Resp 12 | Ht 60.0 in | Wt 185.5 lb

## 2020-10-03 DIAGNOSIS — E039 Hypothyroidism, unspecified: Secondary | ICD-10-CM

## 2020-10-03 DIAGNOSIS — Z1211 Encounter for screening for malignant neoplasm of colon: Secondary | ICD-10-CM

## 2020-10-03 DIAGNOSIS — E119 Type 2 diabetes mellitus without complications: Secondary | ICD-10-CM

## 2020-10-03 DIAGNOSIS — E782 Mixed hyperlipidemia: Secondary | ICD-10-CM

## 2020-10-03 LAB — POC HEMOCCULT BLD/STL (HOME/3-CARD/SCREEN)
Card #2 Fecal Occult Blod, POC: NEGATIVE
Card #3 Fecal Occult Blood, POC: NEGATIVE
Fecal Occult Blood, POC: NEGATIVE

## 2020-10-03 MED ORDER — METFORMIN HCL ER 500 MG PO TB24: ORAL_TABLET | ORAL | 11 refills | Status: DC

## 2020-10-03 MED ORDER — AGAMATRIX PRESTO TEST VI STRP: ORAL_STRIP | 11 refills | Status: DC

## 2020-10-03 MED ORDER — AGAMATRIX ULTRA-THIN LANCETS MISC: 11 refills | Status: DC

## 2020-10-03 MED ORDER — AGAMATRIX PRESTO W/DEVICE KIT: PACK | 0 refills | Status: DC

## 2020-10-03 NOTE — Progress Notes (Signed)
Patient notified at visit today stool card negative for blood

## 2020-10-03 NOTE — Progress Notes (Signed)
° ° °  Subjective:    Patient ID: Sarah Massey, female   DOB: 11-10-1970, 50 y.o.   MRN: [id number]   HPI   Interpreted by Rush Barer  1.  DM:  New diagnosis.  Was in prediabetic range in 2011 when pregnant.  A1C now 7.2%.  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   Discussed weight loss with lifestyle change in diet and physical activity can help her get back toward normal blood sugars.  2.  Hyperlipidemia:  Discussed often a problem with DM.    3.  Hypothyroidism:  TSH in normal range.  Current Meds  Medication Sig   albuterol (PROVENTIL HFA;VENTOLIN HFA) 108 (90 Base) MCG/ACT inhaler Inhale 1-2 puffs into the lungs every 6 (six) hours as needed for wheezing or shortness of breath.   cetirizine (ZYRTEC) 10 MG tablet Take 10 mg by mouth daily.   fluticasone (FLOVENT HFA) 110 MCG/ACT inhaler 1 puff twice daily followed by brushing teeth and tongue   levothyroxine (SYNTHROID) 50 MCG tablet Take 50 mcg by mouth daily before breakfast. 1 tab by mouth daily on empty stomach   No Known Allergies   Review of Systems    Objective:   BP 130/83 (BP Location: Right Arm, Patient Position: Sitting, Cuff Size: Normal)    Pulse 74    Resp 12    Ht 5' (1.524 m)    Wt 185 lb 8 oz (84.1 kg)    BMI 36.23 kg/m   Physical Exam  NAD HEENT:  PERRL, EOMI,  Neck:  Supple, No adenopathy, no thyromegaly Chest:  CTA CV:  RRR without murmur or rub.  Radial and DP pulses normal and equal Abd:  S,NT, No HSM or mass, + BS LE:  No edema.   Assessment & Plan   1.  DM:  Glucose monitoring supplies.  Metformin ER 500 mg twice daily with meals.  2.  Hyperlipidemia:  No meds for now.  Will see if improves to goal with LDL < 70 as DM controlled.  3.  Hypothyroidism:  Adequately replaced.

## 2020-10-04 ENCOUNTER — Ambulatory Visit: Payer: No Typology Code available for payment source

## 2020-10-17 ENCOUNTER — Other Ambulatory Visit: Payer: Self-pay | Admitting: Obstetrics and Gynecology

## 2020-10-17 DIAGNOSIS — Z1231 Encounter for screening mammogram for malignant neoplasm of breast: Secondary | ICD-10-CM

## 2020-10-23 ENCOUNTER — Telehealth: Payer: Self-pay | Admitting: Internal Medicine

## 2020-10-23 ENCOUNTER — Other Ambulatory Visit: Payer: Self-pay | Admitting: Internal Medicine

## 2020-10-23 NOTE — Telephone Encounter (Addendum)
Patient was requesting a transfer of Rx but I was able to helped her over the phone to communicate with pharmacist to transfer her RX.   UPDATE: [facility] called [facility]ROID[facility] 50 MCG tablet to [facility]. Since [facility] to [facility].

## 2020-10-24 NOTE — Telephone Encounter (Signed)
So, this was taken care of after all?   Not clear

## 2020-10-25 NOTE — Telephone Encounter (Signed)
No. [facility] refused to do the [facility] without your authorization.

## 2020-10-29 NOTE — Telephone Encounter (Signed)
Sent electronically 10/25/20

## 2020-11-28 ENCOUNTER — Ambulatory Visit: Payer: Self-pay | Admitting: *Deleted

## 2020-11-28 ENCOUNTER — Other Ambulatory Visit: Payer: Self-pay

## 2020-11-28 ENCOUNTER — Encounter (INDEPENDENT_AMBULATORY_CARE_PROVIDER_SITE_OTHER): Payer: Self-pay

## 2020-11-28 ENCOUNTER — Ambulatory Visit
Admission: RE | Admit: 2020-11-28 | Discharge: 2020-11-28 | Disposition: A | Discharge status: Home / Self Care | Payer: No Typology Code available for payment source | Source: Ambulatory Visit | Attending: Obstetrics and Gynecology | Admitting: Obstetrics and Gynecology

## 2020-11-28 ENCOUNTER — Ambulatory Visit: Payer: No Typology Code available for payment source

## 2020-11-28 VITALS — BP 160/110 | Wt 179.9 lb

## 2020-11-28 DIAGNOSIS — Z1231 Encounter for screening mammogram for malignant neoplasm of breast: Secondary | ICD-10-CM

## 2020-11-28 DIAGNOSIS — Z1239 Encounter for other screening for malignant neoplasm of breast: Secondary | ICD-10-CM

## 2020-11-28 NOTE — Progress Notes (Addendum)
Ms. Aalaiyah Massey is a 50 y.o. female who presents to [facility] clinic today with no complaints.    Pap Smear: Pap smear not completed today. Last Pap smear was 02/03/2018 at [facility] clinic and was normal. Per patient has no history of an abnormal Pap smear. Last Pap smear result is available in Epic.   Physical exam: Breasts Breasts symmetrical. No skin abnormalities bilateral breasts. No nipple retraction right breast. Left nipple slightly inverted that per patient is normal for her. No nipple discharge bilateral breasts. No lymphadenopathy. No lumps palpated bilateral breasts. No complaints of pain or tenderness on exam.     MS DIGITAL SCREENING BILATERAL  Result Date: 07/24/2016 CLINICAL DATA:  Screening. EXAM: DIGITAL SCREENING BILATERAL MAMMOGRAM WITH CAD COMPARISON:  Previous exam(s). ACR Breast Density Category c: The breast tissue is heterogeneously dense, which may obscure small masses. FINDINGS: There are no findings suspicious for malignancy. Images were processed with CAD. IMPRESSION: No mammographic evidence of malignancy. A result letter of this screening mammogram will be mailed directly to the patient. RECOMMENDATION: Screening mammogram in one year. (Code:SM-B-01Y) BI-RADS CATEGORY  1: Negative. Electronically Signed   By: Lillia Mountain M.D.   On: 07/27/2016 16:47   MS DIGITAL SCREENING TOMO BILATERAL  Result Date: 03/02/2018 CLINICAL DATA:  Screening. EXAM: DIGITAL SCREENING BILATERAL MAMMOGRAM WITH TOMO AND CAD COMPARISON:  Previous exam(s). ACR Breast Density Category b: There are scattered areas of fibroglandular density. FINDINGS: There are no findings suspicious for malignancy. Images were processed with CAD. IMPRESSION: No mammographic evidence of malignancy. A result letter of this screening mammogram will be mailed directly to the patient. RECOMMENDATION: Screening mammogram in one year. (Code:SM-B-01Y) BI-RADS CATEGORY  1: Negative. Electronically Signed   By:  Abelardo Diesel M.D.   On: 03/02/2018 15:26    Pelvic/Bimanual Pap is not indicated today per BCCCP guidelines.    Smoking History: Patient has never smoked.   Patient Navigation: Patient education provided. Access to services provided for patient through [place] program. Spanish interpreter Rudene Anda from [facility] provided.   Colorectal Cancer Screening: Per patient has never had colonoscopy completed. No complaints today.    Breast and Cervical Cancer Risk Assessment: Patient does not have family history of breast cancer, known genetic mutations, or radiation treatment to the chest before age 30. Patient does not have history of cervical dysplasia, immunocompromised, or DES exposure in-utero.  Risk Assessment    Risk Scores      11/28/2020   Last edited by: Loletta Parish, RN   5-year risk: 0.5 %   Lifetime risk: 4.2 %         A: BCCCP exam without pap smear No complaints.  P: Referred patient to the [facility] for a screening mammogram on the mobile unit. Appointment scheduled Thursday, November 28, 2020 at 1100.  Loletta Parish, RN 11/28/2020 10:33 AM

## 2020-11-28 NOTE — Patient Instructions (Signed)
Explained breast self awareness with Frankey Poot. Patient did not need a Pap smear today due to last Pap smear was 02/03/2018. Let her know BCCCP will cover Pap smears every 3 years unless has a history of abnormal Pap smears. Referred patient to the [facility] for a screening mammogram on the mobile unit. Appointment scheduled Thursday, November 28, 2020 at 1100. Patient escorted to the mobile unit following BCCCP appointment for her screening mammogram. Let patient know the [facility] will follow up with her within the next couple weeks with results her mammogram by letter or phone. Wendell verbalized understanding.  Rishaan Gunner, Arvil Chaco, RN 10:33 AM

## 2020-11-28 NOTE — Progress Notes (Signed)
Elevated BP readings reported to [facility] @ Dr. Melissa Noon office. Patient was emotional, and crying while in office. Sarah Massey to report to Dr. Amil Amen, and nursing staff, as Dr. Amil Amen is out of the office today. Patient left office in no distress.

## 2020-11-29 ENCOUNTER — Telehealth: Payer: Self-pay | Admitting: Internal Medicine

## 2020-11-29 NOTE — Telephone Encounter (Signed)
Discussed with Dr.Mulberry- Elevated 160/110  Bp readings reported by Honor Junes.  Per Dr. Amil Amen- pt. Needs an appointment for BP check  Appointment scheduled for 12/03/2020 at 2:45pm Pt. Aware of appointment

## 2020-12-03 ENCOUNTER — Encounter: Payer: Self-pay | Admitting: Internal Medicine

## 2020-12-03 ENCOUNTER — Ambulatory Visit: Payer: Self-pay | Admitting: Internal Medicine

## 2020-12-03 ENCOUNTER — Other Ambulatory Visit: Payer: Self-pay | Admitting: Obstetrics and Gynecology

## 2020-12-03 ENCOUNTER — Other Ambulatory Visit: Payer: Self-pay

## 2020-12-03 VITALS — BP 118/84

## 2020-12-03 DIAGNOSIS — R928 Other abnormal and inconclusive findings on diagnostic imaging of breast: Secondary | ICD-10-CM

## 2020-12-03 DIAGNOSIS — R030 Elevated blood-pressure reading, without diagnosis of hypertension: Secondary | ICD-10-CM

## 2020-12-03 NOTE — Progress Notes (Signed)
BCCCP called when seeing patient--her BP was elevated at 160/110 there reportedly and wanted her to have a follow up.    She feels this is related to her Metformin ER, which she takes 500 mg twice daily with meals.     Blood pressure 118/84, last menstrual period 08/14/2018.    BP today is fine.

## 2020-12-19 ENCOUNTER — Other Ambulatory Visit: Payer: Self-pay

## 2020-12-19 ENCOUNTER — Ambulatory Visit
Admission: RE | Admit: 2020-12-19 | Discharge: 2020-12-19 | Disposition: A | Discharge status: Home / Self Care | Payer: No Typology Code available for payment source | Source: Ambulatory Visit | Attending: Obstetrics and Gynecology | Admitting: Obstetrics and Gynecology

## 2020-12-19 ENCOUNTER — Ambulatory Visit: Payer: No Typology Code available for payment source

## 2020-12-19 DIAGNOSIS — R928 Other abnormal and inconclusive findings on diagnostic imaging of breast: Secondary | ICD-10-CM

## 2021-01-06 ENCOUNTER — Other Ambulatory Visit: Payer: Self-pay | Admitting: Internal Medicine

## 2021-01-06 ENCOUNTER — Other Ambulatory Visit: Payer: Self-pay

## 2021-01-06 DIAGNOSIS — E119 Type 2 diabetes mellitus without complications: Secondary | ICD-10-CM

## 2021-01-06 NOTE — Addendum Note (Signed)
Addended by: Marcelino Duster on: 01/06/2021 03:43 PM   Modules accepted: Orders

## 2021-01-07 LAB — HGB A1C W/O EAG: Hgb A1c MFr Bld: 6.3 % — ABNORMAL HIGH (ref 4.8–5.6)

## 2021-01-10 ENCOUNTER — Other Ambulatory Visit: Payer: Self-pay | Admitting: Internal Medicine

## 2021-01-10 ENCOUNTER — Encounter: Payer: Self-pay | Admitting: Internal Medicine

## 2021-01-10 ENCOUNTER — Ambulatory Visit: Payer: Self-pay | Admitting: Internal Medicine

## 2021-01-10 ENCOUNTER — Other Ambulatory Visit: Payer: Self-pay

## 2021-01-10 VITALS — BP 128/80 | HR 64 | Resp 12 | Ht 60.0 in | Wt 177.0 lb

## 2021-01-10 DIAGNOSIS — Z124 Encounter for screening for malignant neoplasm of cervix: Secondary | ICD-10-CM

## 2021-01-10 DIAGNOSIS — N950 Postmenopausal bleeding: Secondary | ICD-10-CM

## 2021-01-10 DIAGNOSIS — E119 Type 2 diabetes mellitus without complications: Secondary | ICD-10-CM

## 2021-01-10 DIAGNOSIS — N841 Polyp of cervix uteri: Secondary | ICD-10-CM

## 2021-01-10 LAB — POCT WET PREP WITH KOH
KOH Prep POC: NEGATIVE
RBC Wet Prep HPF POC: NEGATIVE
Trichomonas, UA: NEGATIVE
Yeast Wet Prep HPF POC: NEGATIVE

## 2021-01-10 LAB — GLUCOSE, POCT (MANUAL RESULT ENTRY): POC Glucose: 135 mg/dl — AB (ref 70–99)

## 2021-01-10 NOTE — Progress Notes (Signed)
     Subjective:    Patient ID: Sarah Massey, female   DOB: October 10, 1970, 50 y.o.   MRN: [id number]   HPI   Interpreted by Leeann Must  1.  DM:  Recent A1C down to 6.3% since start of treatment in January.  Continues on Metformin XR 500 mg twice daily.   She has lost 9 lbs since January She walks about 40 minutes and 20 minutes of stationary bike daily.   Avoiding fried foods, has cut back to about 1/2 of intake previously. She is juicing her veggies however and filtering out the solids--discussed would not do this.  2.  Had light menstrual flow for 2 days beginning on March 30th, 2022.  Had not had a period for 2 years.  Prior to the flow, did not have breast tenderness, though did have some pelvic cramping.  The flow was very dark--almost black.   No intercourse in days prior and no vaginal discharge prior.   Is due for a pap smear.    Current Meds  Medication Sig  . AgaMatrix Ultra-Thin Lancets MISC Check blood sugar twice daily before meals  . albuterol (PROVENTIL HFA;VENTOLIN HFA) 108 (90 Base) MCG/ACT inhaler Inhale 1-2 puffs into the lungs every 6 (six) hours as needed for wheezing or shortness of breath.  . Blood Glucose Monitoring Suppl (AGAMATRIX PRESTO) w/Device KIT Check blood sugar twice daily before meals  . EUTHYROX 50 MCG tablet TAKE 1 TABLET BY MOUTH ONCE DAILY BEFORE BREAKFAST  . fluticasone (FLOVENT HFA) 110 MCG/ACT inhaler 1 puff twice daily followed by brushing teeth and tongue  . glucose blood (AGAMATRIX PRESTO TEST) test strip Check blood sugar twice daily before meals  . metFORMIN (GLUCOPHAGE-XR) 500 MG 24 hr tablet 1 tab by mouth twice daily with meals   No Known Allergies   Review of Systems    Objective:   BP 128/80 (BP Location: Right Arm, Patient Position: Sitting, Cuff Size: Normal)   Pulse 64   Resp 12   Ht 5' (1.524 m)   Wt 177 lb (80.3 kg)   LMP 08/14/2018 (Approximate)   BMI 34.57 kg/m   Physical Exam  NAD Lungs:  CTA CV:   RRR without murmur or rub.  Radial and DP pulses normal and equal Abd:  S, NT, No HSM or mass.  + BS.  Has two tiny rectangular pieces of tape on her abdomen that she states are there following acupuncture. GU:  Normal external female genitalia.  No vaginal mucosal lesion and without atrophy.  Mild white vaginal discharge.  Cervix without lesion save for what looks like a very small endocervical polyp that is friable and bleeds easily with application of cervical brush for pap. No uterine or adnexal mass or tenderness.    Wet prep unremarkable. Assessment & Plan   1.  DM:  Much improved control.  To continue to work on diet and physical activity changes. Follow up in 4 months for fasting labs and subsequent OV  2.  Postmenopausal bleeding:  Suspect likely due to what appears to be an early or very small cervical polyp.  However, send for pelvic ultrasound to further evaluate.   Pap pending.

## 2021-01-14 LAB — CYTOLOGY - PAP

## 2021-01-30 ENCOUNTER — Telehealth: Payer: Self-pay | Admitting: Internal Medicine

## 2021-01-30 NOTE — Telephone Encounter (Signed)
Please schedule tomorrow at 2:15 p.m.

## 2021-01-30 NOTE — Telephone Encounter (Signed)
Patient called today (01/30/21) Because she is not feeling well she has headaches and she only had vomiting in the morning yesterday she mentioned twice. She can not sleep as well.   She would like to make an appointment.

## 2021-01-31 ENCOUNTER — Ambulatory Visit (INDEPENDENT_AMBULATORY_CARE_PROVIDER_SITE_OTHER): Payer: Self-pay | Admitting: Internal Medicine

## 2021-01-31 ENCOUNTER — Other Ambulatory Visit: Payer: Self-pay

## 2021-01-31 ENCOUNTER — Encounter: Payer: Self-pay | Admitting: Internal Medicine

## 2021-01-31 VITALS — BP 158/80 | HR 60 | Resp 16 | Ht 60.0 in | Wt 176.5 lb

## 2021-01-31 DIAGNOSIS — H93A1 Pulsatile tinnitus, right ear: Secondary | ICD-10-CM

## 2021-01-31 DIAGNOSIS — G43109 Migraine with aura, not intractable, without status migrainosus: Secondary | ICD-10-CM

## 2021-01-31 MED ORDER — SUMATRIPTAN SUCCINATE 50 MG PO TABS: ORAL_TABLET | ORAL | 11 refills | Status: DC

## 2021-01-31 NOTE — Telephone Encounter (Signed)
Patient was scheduled at 2:15 on 01/31/21.

## 2021-01-31 NOTE — Progress Notes (Signed)
    Subjective:    Patient ID: Sarah Massey, female   DOB: December 31, 1970, 50 y.o.   MRN: [id number]   HPI   Christianne Dolin interprets  Five days ago, developed right headache-frontal/temporal/parietal and occipital with blurry vision and vomiting.  Noted rushing noise in right ear with this.  Felt hot from chest up.   Had headache all day Sunday.   Tylenol helped a bit.   Monday, awakened with headache again and vomited on and off with headache all day.   Since Monday night, Has continued to have a mild headache while taking twice daily Tylenol, but has not vomited since Monday. Not sleeping well due to the headache in the middle of night. She has never had this before.  Had a similar episode of same headache in 2017--had 3 episodes.  States she was treated with injections at least 3 times during that year for the headache.    She was told she was having migraines.  She has never had imaging of her brain.   Before one of these headache episodes, she develops blurry vision and flashing dark dots in her visual field.   Headaches starts right frontal and radiates over and back as a heaviness, then pressure in and can throb. + photophobia + phonophobia Stays in house and puts ice on her head. Turns off lights and covers head.        Current Meds  Medication Sig  . AgaMatrix Ultra-Thin Lancets MISC Check blood sugar twice daily before meals  . albuterol (PROVENTIL HFA;VENTOLIN HFA) 108 (90 Base) MCG/ACT inhaler Inhale 1-2 puffs into the lungs every 6 (six) hours as needed for wheezing or shortness of breath.  . Blood Glucose Monitoring Suppl (AGAMATRIX PRESTO) w/Device KIT Check blood sugar twice daily before meals  . cetirizine (ZYRTEC) 10 MG tablet Take 10 mg by mouth daily.  . EUTHYROX 50 MCG tablet TAKE 1 TABLET BY MOUTH ONCE DAILY BEFORE BREAKFAST  . fluticasone (FLOVENT HFA) 110 MCG/ACT inhaler 1 puff twice daily followed by brushing teeth and tongue  . glucose blood  (AGAMATRIX PRESTO TEST) test strip Check blood sugar twice daily before meals  . metFORMIN (GLUCOPHAGE-XR) 500 MG 24 hr tablet 1 tab by mouth twice daily with meals   No Known Allergies   Review of Systems    Objective:   BP (!) 158/80 (BP Location: Right Arm, Patient Position: Sitting, Cuff Size: Normal)   Pulse 60   Resp 16   Ht 5' (1.524 m)   Wt 176 lb 8 oz (80.1 kg)   LMP 08/14/2018 (Approximate)   BMI 34.47 kg/m   Physical Exam  NAD HEENT:  PERRL EOMI, Discs sharp,TMs pearly gray, no bruit over right scalp Neck:  Supple, No adenopathy Chest:  CTA CV:  RRR without murmur or rub.  Radial and DP pulses normal and equal Abd:  S, + BS LE:  No edema Neuro:  A & O x 3, CN II-XII grossly intact, DTRs 2+/4, Motor 5/5, sensory grossly normal.  Gait normal.      Assessment & Plan   Likely classic migraine, but with relatively new onset and rushing noise in ear, would like to get a baseline brain imaging to rule out an obvious vascular abnormality. Sumatriptan 50 mg for headache, may repeat in 2 hours if not relieved.  Max 100 mg for now in 24 hours.  Notified pap was normal.  Await pelvic ultrasound

## 2021-02-12 DIAGNOSIS — E039 Hypothyroidism, unspecified: Secondary | ICD-10-CM | POA: Insufficient documentation

## 2021-02-12 DIAGNOSIS — E669 Obesity, unspecified: Secondary | ICD-10-CM | POA: Insufficient documentation

## 2021-02-12 DIAGNOSIS — E782 Mixed hyperlipidemia: Secondary | ICD-10-CM | POA: Insufficient documentation

## 2021-02-12 DIAGNOSIS — E119 Type 2 diabetes mellitus without complications: Secondary | ICD-10-CM | POA: Insufficient documentation

## 2021-02-12 DIAGNOSIS — Z6834 Body mass index (BMI) 34.0-34.9, adult: Secondary | ICD-10-CM | POA: Insufficient documentation

## 2021-03-24 ENCOUNTER — Telehealth: Payer: Self-pay | Admitting: Internal Medicine

## 2021-03-24 ENCOUNTER — Encounter: Payer: Self-pay | Admitting: Internal Medicine

## 2021-03-24 DIAGNOSIS — N950 Postmenopausal bleeding: Secondary | ICD-10-CM

## 2021-03-24 DIAGNOSIS — E236 Other disorders of pituitary gland: Secondary | ICD-10-CM

## 2021-03-24 HISTORY — DX: Postmenopausal bleeding: N95.0

## 2021-03-24 NOTE — Telephone Encounter (Signed)
Also, MR of brain same day as pelvic U/S, also with [facility] shows changes consistent with migraines. She also has the possibility of increased pressure inside her skull.  I would like to refer her to optometry for a dilated eye exam to examine the nerves to her eyes to see if there are changes called and empty sella to suggest she may have increased pressure in her skull.   Please also schedule her for early morning cortisol level for diagnosis of "Empty sella syndrome"

## 2021-03-24 NOTE — Telephone Encounter (Signed)
[facility] pelvic u/s with benign small fibroid and right ovarian cyst--stable.   Patient to be notified no concern for findings. To call if continued problems with bleeding.

## 2021-03-25 NOTE — Telephone Encounter (Signed)
Pt notified of lab results. Cortisol AM scheduled for 04/01/21 at 9am

## 2021-03-28 NOTE — Telephone Encounter (Signed)
Referral given to front desk

## 2021-04-01 ENCOUNTER — Other Ambulatory Visit: Payer: Self-pay

## 2021-04-01 DIAGNOSIS — E236 Other disorders of pituitary gland: Secondary | ICD-10-CM

## 2021-04-02 LAB — CORTISOL-AM, BLOOD: Cortisol - AM: 13 ug/dL (ref 6.2–19.4)

## 2021-05-23 ENCOUNTER — Ambulatory Visit: Payer: Self-pay | Admitting: Internal Medicine

## 2021-06-18 ENCOUNTER — Other Ambulatory Visit: Payer: Self-pay

## 2021-06-18 ENCOUNTER — Ambulatory Visit: Payer: Self-pay | Admitting: Internal Medicine

## 2021-06-18 ENCOUNTER — Encounter: Payer: Self-pay | Admitting: Internal Medicine

## 2021-06-18 VITALS — BP 136/102 | HR 64 | Resp 12 | Ht 60.0 in | Wt 176.0 lb

## 2021-06-18 DIAGNOSIS — K047 Periapical abscess without sinus: Secondary | ICD-10-CM

## 2021-06-18 MED ORDER — PENICILLIN V POTASSIUM 250 MG PO TABS: ORAL_TABLET | ORAL | 0 refills | Status: DC

## 2021-06-18 NOTE — Patient Instructions (Signed)
Ibuprofen 200 mg  2-4 pastillas con comida cada 6 horas a necesita dolor

## 2021-06-18 NOTE — Progress Notes (Signed)
    Subjective:    Patient ID: Sarah Massey, female   DOB: 18-Nov-1970, 50 y.o.   MRN: [id number]   HPI  Left upper back tooth and another on right bottom bothering her for about 1 week.  Has taken ibuprofen, which helps a bit.  Mild swelling on left face.  No fever.    Current Meds  Medication Sig   albuterol (PROVENTIL HFA;VENTOLIN HFA) 108 (90 Base) MCG/ACT inhaler Inhale 1-2 puffs into the lungs every 6 (six) hours as needed for wheezing or shortness of breath.   EUTHYROX 50 MCG tablet TAKE 1 TABLET BY MOUTH ONCE DAILY BEFORE BREAKFAST   fluticasone (FLOVENT HFA) 110 MCG/ACT inhaler 1 puff twice daily followed by brushing teeth and tongue   metFORMIN (GLUCOPHAGE-XR) 500 MG 24 hr tablet 1 tab by mouth twice daily with meals   No Known Allergies   Review of Systems    Objective:   BP (!) 136/102 (BP Location: Right Arm, Patient Position: Sitting, Cuff Size: Normal)   Pulse 64   Resp 12   Ht 5' (1.524 m)   Wt 176 lb (79.8 kg)   LMP 08/14/2018 (Approximate)   BMI 34.37 kg/m   Physical Exam HEENT:  PERRL EOMI, TMs pearly gray.   Throat without injection.  Left upper posterior molar with large intact filling.  Pain on tapping tooth.  No fluctuance of surrounding gingiva, though tender.  No erythema or swelling. Right mid lower molar with intact filling and pain on tapping tooth.  No surrounding swelling or fluctuance of gingiva. Neck:  Supple, mild tenderness in left anterior cervical area, though no adenopathy Chest:  CTA CV:  RRR without murmur or rub.   Assessment & Plan    Dental pain/likely abscessed:  pen v k 250 mg 4 times daily for 7 days.  Ibuprofen for pain to take with food.  Dental referral.

## 2021-07-23 ENCOUNTER — Other Ambulatory Visit: Payer: Self-pay

## 2021-07-23 MED ORDER — LEVOTHYROXINE SODIUM 50 MCG PO TABS: 50.0000 ug | ORAL_TABLET | Freq: Every day | ORAL | 10 refills | Status: DC

## 2021-08-25 ENCOUNTER — Other Ambulatory Visit: Payer: Self-pay

## 2021-08-25 MED ORDER — METFORMIN HCL ER 500 MG PO TB24: ORAL_TABLET | ORAL | 9 refills | Status: DC

## 2021-12-09 ENCOUNTER — Telehealth: Payer: Self-pay

## 2021-12-09 NOTE — Telephone Encounter (Signed)
Patient would like an appt for left back pain that she has had for about a week. Patient describes that it hurts when she takes deep breaths. Also has general muscle aches on all of her body. Patient has taken ibuprofen and tylenol which helps for a while, but pain always returns. ?

## 2021-12-17 NOTE — Telephone Encounter (Signed)
Patient has been scheduled for 12/18/21 ?

## 2021-12-18 ENCOUNTER — Ambulatory Visit: Payer: Self-pay | Admitting: Internal Medicine

## 2021-12-18 ENCOUNTER — Encounter: Payer: Self-pay | Admitting: Internal Medicine

## 2021-12-18 VITALS — BP 122/82 | HR 84 | Resp 16 | Ht 60.0 in | Wt 178.0 lb

## 2021-12-18 DIAGNOSIS — I83813 Varicose veins of bilateral lower extremities with pain: Secondary | ICD-10-CM

## 2021-12-18 NOTE — Progress Notes (Signed)
? ? ?  Subjective:  ?  ?Patient ID: Sarah Massey, female   DOB: 1971/08/30, 51 y.o.   MRN: [id number] ? ? ?HPI ? ?Karen Kays interprets ? ?Not having joint pain, her entire bilateral legs hurt for past 3 weeks.  Describes an aching.  Legs feel very tired.  Does not feel she has been doing anything differently.  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  Started this about 2 months ago. ?Even if has a good night's sleep, when she first gets up to walk, feels the discomfort.  The pain is gone when awakens in bed, but starts up immediately when up on feet and walking. ?Did note some pain in her left low back before the leg pain started.  The back pain resolved about 4 days ago.   ?Has taken ibuprofen 800 mg twice daily, which helps with the pain, but then pain recurs.  Helped with leg and back pain.   ?No swelling of legs.   ? ? ?Current Meds  ?Medication Sig  ? AgaMatrix Ultra-Thin Lancets MISC Check blood sugar twice daily before meals  ? albuterol (PROVENTIL HFA;VENTOLIN HFA) 108 (90 Base) MCG/ACT inhaler Inhale 1-2 puffs into the lungs every 6 (six) hours as needed for wheezing or shortness of breath.  ? Blood Glucose Monitoring Suppl (AGAMATRIX PRESTO) w/Device KIT Check blood sugar twice daily before meals  ? cetirizine (ZYRTEC) 10 MG tablet Take 10 mg by mouth daily.  ? fluticasone (FLOVENT HFA) 110 MCG/ACT inhaler 1 puff twice daily followed by brushing teeth and tongue (Patient taking differently: 1 puff twice daily followed by brushing teeth and tongue occasionally)  ? levothyroxine (EUTHYROX) 50 MCG tablet Take 1 tablet (50 mcg total) by mouth daily before breakfast.  ? metFORMIN (GLUCOPHAGE-XR) 500 MG 24 hr tablet 1 tab by mouth twice daily with meals  ? SUMAtriptan (IMITREX) 50 MG tablet 1 tab by mouth for headache May repeat in 2 hours if headache persists or recurs.  ? ?No Known Allergies ? ? ?Review of  Systems ? ? ? ?Objective:  ? ?BP 122/82 (BP Location: Left Arm, Patient Position: Sitting, Cuff Size: Normal)   Pulse 84   Resp 16   Ht 5' (1.524 m)   Wt 178 lb (80.7 kg)   LMP 08/14/2018 (Approximate)   BMI 34.76 kg/m?  ? ?Physical Exam ?No swelling of legs, but significant varicosities, particularly of lower legs, bilaterally when legs are dependent.  Not obvious when legs elevated on exam table.   ? ? ?Assessment & Plan  ? ? Varicose veins of lower extrems:  Thigh high compression stockings--order form given.  Measure first thing in morning. ?When sitting, recline and elevate legs above heart.  ?Avoid prolonged standing.   ?Move if on feet. ? ?

## 2021-12-23 ENCOUNTER — Other Ambulatory Visit: Payer: Self-pay

## 2021-12-23 DIAGNOSIS — E782 Mixed hyperlipidemia: Secondary | ICD-10-CM

## 2021-12-23 DIAGNOSIS — E119 Type 2 diabetes mellitus without complications: Secondary | ICD-10-CM

## 2021-12-23 DIAGNOSIS — Z79899 Other long term (current) drug therapy: Secondary | ICD-10-CM

## 2021-12-23 DIAGNOSIS — E039 Hypothyroidism, unspecified: Secondary | ICD-10-CM

## 2021-12-24 LAB — TSH: TSH: 2.12 u[IU]/mL (ref 0.450–4.500)

## 2021-12-24 LAB — COMPREHENSIVE METABOLIC PANEL
ALT: 24 IU/L (ref 0–32)
AST: 21 IU/L (ref 0–40)
Albumin/Globulin Ratio: 1.7 (ref 1.2–2.2)
Albumin: 4.6 g/dL (ref 3.8–4.9)
Alkaline Phosphatase: 75 IU/L (ref 44–121)
BUN/Creatinine Ratio: 19 (ref 9–23)
BUN: 11 mg/dL (ref 6–24)
Bilirubin Total: 0.3 mg/dL (ref 0.0–1.2)
CO2: 26 mmol/L (ref 20–29)
Calcium: 9.5 mg/dL (ref 8.7–10.2)
Chloride: 104 mmol/L (ref 96–106)
Creatinine, Ser: 0.57 mg/dL (ref 0.57–1.00)
Globulin, Total: 2.7 g/dL (ref 1.5–4.5)
Glucose: 104 mg/dL — ABNORMAL HIGH (ref 70–99)
Potassium: 5 mmol/L (ref 3.5–5.2)
Sodium: 143 mmol/L (ref 134–144)
Total Protein: 7.3 g/dL (ref 6.0–8.5)
eGFR: 110 mL/min/{1.73_m2} (ref >59)

## 2021-12-24 LAB — CBC WITH DIFFERENTIAL/PLATELET
Basophils Absolute: 0 10*3/uL (ref 0.0–0.2)
Basos: 1 %
EOS (ABSOLUTE): 0.2 10*3/uL (ref 0.0–0.4)
Eos: 4 %
Hematocrit: 39.1 % (ref 34.0–46.6)
Hemoglobin: 13.4 g/dL (ref 11.1–15.9)
Immature Grans (Abs): 0 10*3/uL (ref 0.0–0.1)
Immature Granulocytes: 0 %
Lymphocytes Absolute: 2.8 10*3/uL (ref 0.7–3.1)
Lymphs: 51 %
MCH: 30.5 pg (ref 26.6–33.0)
MCHC: 34.3 g/dL (ref 31.5–35.7)
MCV: 89 fL (ref 79–97)
Monocytes Absolute: 0.3 10*3/uL (ref 0.1–0.9)
Monocytes: 6 %
Neutrophils Absolute: 2.1 10*3/uL (ref 1.4–7.0)
Neutrophils: 38 %
Platelets: 200 10*3/uL (ref 150–450)
RBC: 4.4 x10E6/uL (ref 3.77–5.28)
RDW: 13.1 % (ref 11.7–15.4)
WBC: 5.5 10*3/uL (ref 3.4–10.8)

## 2021-12-24 LAB — LIPID PANEL W/O CHOL/HDL RATIO
Cholesterol, Total: 236 mg/dL — ABNORMAL HIGH (ref 100–199)
HDL: 43 mg/dL (ref >39)
LDL Chol Calc (NIH): 153 mg/dL — ABNORMAL HIGH (ref 0–99)
Triglycerides: 220 mg/dL — ABNORMAL HIGH (ref 0–149)
VLDL Cholesterol Cal: 40 mg/dL (ref 5–40)

## 2021-12-24 LAB — HEMOGLOBIN A1C
Est. average glucose Bld gHb Est-mCnc: 134 mg/dL
Hgb A1c MFr Bld: 6.3 % — ABNORMAL HIGH (ref 4.8–5.6)

## 2022-02-12 ENCOUNTER — Ambulatory Visit: Payer: Self-pay | Admitting: Internal Medicine

## 2022-02-12 ENCOUNTER — Encounter: Payer: Self-pay | Admitting: Internal Medicine

## 2022-02-12 VITALS — BP 136/94 | HR 72 | Resp 12 | Ht 60.0 in | Wt 178.0 lb

## 2022-02-12 DIAGNOSIS — R030 Elevated blood-pressure reading, without diagnosis of hypertension: Secondary | ICD-10-CM

## 2022-02-12 DIAGNOSIS — I83813 Varicose veins of bilateral lower extremities with pain: Secondary | ICD-10-CM

## 2022-02-12 DIAGNOSIS — E119 Type 2 diabetes mellitus without complications: Secondary | ICD-10-CM

## 2022-02-12 DIAGNOSIS — Z23 Encounter for immunization: Secondary | ICD-10-CM

## 2022-02-12 DIAGNOSIS — E039 Hypothyroidism, unspecified: Secondary | ICD-10-CM

## 2022-02-12 MED ORDER — SUMATRIPTAN SUCCINATE 50 MG PO TABS: ORAL_TABLET | ORAL | 11 refills | Status: DC

## 2022-02-12 NOTE — Progress Notes (Signed)
Subjective:    Patient ID: Sarah Massey, female   DOB: 01/31/1971, 51 y.o.   MRN: [id number]   HPI  Karen Kays interprets.   Varicose veins with leg pain:  She did not obtain the compressions stockings.  3 days a week, drinking green juice:  celery, cucumber, ginger, green apple, pineapple.  She is also elevating her legs regularly.  Does not feel she needs the compression stockings due to improvement in pain.    2.  DM:  A1C at goal at 6.3%.  She did have her eyes checked after order in July 2022.  States did not have diabetic change. Admits she has not been physically active.    3.  Elevated BP:  states she is worried about her children coming back from [place]-- in their early 20s.  They do have [place], but she is concerned something may happen that they are not allowed back.  She has not been able to sleep for a week.    4.  Hyperlipidemia:   See lack of physical activity with DM above. She has work out machines and also plans to walk.  Starting at 30 minutes daily and will gradually increase to 1.5 hours daily.  Also will cut back on tortillas to 2 with each meal.  Currently eating 6 each.    5.  Hypothyroidism:  TSH in range.  6.  HM:  encouraged COVID bivalent booster, getting yearly influenza vaccine (she states she gets really sick with influenza vaccine)  Also, recommended shingrix, but waiting for delayed shipment.    Current Meds  Medication Sig   albuterol (PROVENTIL HFA;VENTOLIN HFA) 108 (90 Base) MCG/ACT inhaler Inhale 1-2 puffs into the lungs every 6 (six) hours as needed for wheezing or shortness of breath.   cetirizine (ZYRTEC) 10 MG tablet Take 10 mg by mouth daily.   fluticasone (FLOVENT HFA) 110 MCG/ACT inhaler 1 puff twice daily followed by brushing teeth and tongue   levothyroxine (EUTHYROX) 50 MCG tablet Take 1 tablet (50 mcg total) by mouth daily before breakfast.   metFORMIN (GLUCOPHAGE-XR) 500 MG 24 hr tablet 1 tab by mouth twice daily with meals    SUMAtriptan (IMITREX) 50 MG tablet 1 tab by mouth for headache May repeat in 2 hours if headache persists or recurs.   No Known Allergies   Review of Systems    Objective:   BP (!) 136/94 (BP Location: Left Arm, Patient Position: Sitting, Cuff Size: Normal)   Pulse 72   Resp 12   Ht 5' (1.524 m)   Wt 178 lb (80.7 kg)   LMP 08/14/2018 (Approximate)   BMI 34.76 kg/m   Physical Exam NAD Lungs:  CTA CV:  RRR without murmur or rub.  Radial pulses normal and equal Abd:  S, NT, No HSM or mass, + BS LE:  no edema.  Varicosities.    Diabetic Foot Exam - Simple   Simple Foot Form Diabetic Foot exam was performed with the following findings: Yes 02/12/2022 12:19 PM  Visual Inspection No deformities, no ulcerations, no other skin breakdown bilaterally: Yes Sensation Testing Intact to touch and monofilament testing bilaterally: Yes Pulse Check Posterior Tibialis and Dorsalis pulse intact bilaterally: Yes Comments     Assessment & Plan   DM:  well controlled.  Schedule this year's eye exam  2.  Elevated BP:  very stressed this week.  Return for repeat in 2 weeks.  Treat if still elevated.  3.  Hyperlipidemia:  Will work on diet and physical activity before considering medication.  Has follow up in October.  4.  Hypothyroidism:  adequately replaced hormone.  5.  Varicosities:  doing better.  Will call if needs help ordering compression stockings if pain not adequately controlled.   6.  HM:  Moderna bivalent.  She is not interested in yearly influenza as feels poorly afterward.  Encouraged her to rethink this.  Shingles perhaps when in for BP check as out currently.   CPE in October.

## 2022-02-12 NOTE — Patient Instructions (Signed)
Metas!!!!!!  Tome un vaso de agua antes de cada comida Tome un minimo de 6 a 8 vasos de agua diarios Coma tres veces al dia Coma una proteina y Ardelia Mems grasa saludable con comida.  (huevos, pescado, pollo, pavo, y limite carnes rojas Coma 5 porciones diarias de legumbres.  Mezcle los colores Coma 2 porciones diarias de frutas con cascara cuando sea comestible Use platos pequeos Suelte su tenedor o cuchara despues de cada mordida hata que se mastique y se trague Come en la mesa con amigos o familiares por lo menos una vez al dia [place] la televisin y aparatos electrnicos durante la comida  Su objetivo debe ser perder una libra por semana  Estudios recientes indican que las personas quienes consumen todos de sus calorias durante 12 horas se bajan de pesocon Mas eficiencia.  Por ejemplo, si Usted come su primera comida a las 7:00 a.m., su comida final del dia se debe completar antes de las 7:00 p.m.

## 2022-02-26 ENCOUNTER — Telehealth: Payer: Self-pay

## 2022-02-26 ENCOUNTER — Ambulatory Visit: Payer: Self-pay

## 2022-02-26 VITALS — BP 130/84 | HR 76

## 2022-02-26 DIAGNOSIS — Z0130 Encounter for examination of blood pressure without abnormal findings: Secondary | ICD-10-CM

## 2022-02-26 DIAGNOSIS — Z23 Encounter for immunization: Secondary | ICD-10-CM

## 2022-02-26 NOTE — Telephone Encounter (Signed)
Patient would like an appointment after experiencing a sore throat for 4 days on her right side. Pain is up to her ear. Had no taken any medication for this issue. Patient is concerns that it is thyroid cancer. She is taking her thyroid medication.

## 2022-02-27 NOTE — Progress Notes (Signed)
After reporting bp to Dr Amil Amen, no changes to her medication will be made.

## 2022-03-02 NOTE — Telephone Encounter (Signed)
Please just make her an appt

## 2022-03-04 ENCOUNTER — Other Ambulatory Visit: Payer: Self-pay

## 2022-03-04 DIAGNOSIS — N6452 Nipple discharge: Secondary | ICD-10-CM

## 2022-03-04 NOTE — Telephone Encounter (Signed)
Patient reported that she is no longer experiencing the sore throat. No longer wants the appointment

## 2022-03-19 ENCOUNTER — Ambulatory Visit: Payer: Self-pay | Admitting: *Deleted

## 2022-03-19 VITALS — BP 140/88 | Wt 179.8 lb

## 2022-03-19 DIAGNOSIS — Z1211 Encounter for screening for malignant neoplasm of colon: Secondary | ICD-10-CM

## 2022-03-19 DIAGNOSIS — Z1239 Encounter for other screening for malignant neoplasm of breast: Secondary | ICD-10-CM

## 2022-03-19 NOTE — Progress Notes (Signed)
[name] is a 51 y.o. female who presents to [facility] clinic today with complaint of left breast crusty black colored discharge x one year that has had an odor x 6 months when expressed.    Pap Smear: Pap smear not completed today. Last Pap smear was 01/10/2021 at [facility] clinic and was normal. Per patient has no history of an abnormal Pap smear. Last Pap smear result is available in Epic.   Physical exam: Breasts Breasts symmetrical. No skin abnormalities bilateral breasts. No nipple retraction right breast. Left nipple slightly inverted that per patient is normal for her. Observed dry skin inside left nipple on exam. No nipple discharge bilateral breasts. Unable to express any nipple discharge from left breast on exam. No lymphadenopathy. No lumps palpated bilateral breasts. No complaints of pain or tenderness on exam.     MS DIGITAL DIAG TOMO UNI LEFT  Result Date: 12/19/2020 CLINICAL DATA:  The patient was called back for left breast asymmetry. EXAM: DIGITAL DIAGNOSTIC UNILATERAL LEFT MAMMOGRAM WITH TOMOSYNTHESIS AND CAD TECHNIQUE: Left digital diagnostic mammography and breast tomosynthesis was performed. The images were evaluated with computer-aided detection. COMPARISON:  Previous exam(s). ACR Breast Density Category b: There are scattered areas of fibroglandular density. FINDINGS: The left breast asymmetry resolves on today's imaging. IMPRESSION: No mammographic evidence of malignancy. RECOMMENDATION: Annual screening mammography. I have discussed the findings and recommendations with the patient. If applicable, a reminder letter will be sent to the patient regarding the next appointment. BI-RADS CATEGORY  1: Negative. Electronically Signed   By: Dorise Bullion III M.D   On: 12/19/2020 10:46   MS DIGITAL SCREENING TOMO BILATERAL  Result Date: 11/29/2020 CLINICAL DATA:  Screening. EXAM: DIGITAL SCREENING BILATERAL MAMMOGRAM WITH TOMOSYNTHESIS AND CAD TECHNIQUE: Bilateral  screening digital craniocaudal and mediolateral oblique mammograms were obtained. Bilateral screening digital breast tomosynthesis was performed. The images were evaluated with computer-aided detection. COMPARISON:  Previous exam(s). ACR Breast Density Category b: There are scattered areas of fibroglandular density. FINDINGS: In the left breast, a possible asymmetry warrants further evaluation. In the right breast, no findings suspicious for malignancy. IMPRESSION: Further evaluation is suggested for possible asymmetry in the left breast. RECOMMENDATION: Diagnostic mammogram and possibly ultrasound of the left breast. (Code:FI-L-37M) The patient will be contacted regarding the findings, and additional imaging will be scheduled. BI-RADS CATEGORY  0: Incomplete. Need additional imaging evaluation and/or prior mammograms for comparison. Electronically Signed   By: Marin Olp M.D.   On: 11/29/2020 13:22   MS DIGITAL SCREENING TOMO BILATERAL  Result Date: 03/02/2018 CLINICAL DATA:  Screening. EXAM: DIGITAL SCREENING BILATERAL MAMMOGRAM WITH TOMO AND CAD COMPARISON:  Previous exam(s). ACR Breast Density Category b: There are scattered areas of fibroglandular density. FINDINGS: There are no findings suspicious for malignancy. Images were processed with CAD. IMPRESSION: No mammographic evidence of malignancy. A result letter of this screening mammogram will be mailed directly to the patient. RECOMMENDATION: Screening mammogram in one year. (Code:SM-B-01Y) BI-RADS CATEGORY  1: Negative. Electronically Signed   By: Abelardo Diesel M.D.   On: 03/02/2018 15:26     Pelvic/Bimanual Pap is not indicated today per BCCCP guidelines.   Smoking History: Patient has never smoked.   Patient Navigation: Patient education provided. Access to services provided for patient through [facility] program. Spanish interpreter [name] from [facility] provided.   Colorectal Cancer Screening: Per patient has never had colonoscopy  completed. Patient stated she completed a FIT Test in April 2022 that was negative. FIT Test given  to patient today to complete. No complaints today.    Breast and Cervical Cancer Risk Assessment: Patient does not have family history of breast cancer, known genetic mutations, or radiation treatment to the chest before age 51. Patient does not have history of cervical dysplasia, immunocompromised, or DES exposure in-utero.  Risk Assessment     Risk Scores       03/19/2022 11/28/2020   Last edited by: Demetrius Revel, LPN Toniann Dickerson, Heath Gold, RN   5-year risk: 0.5 % 0.5 %   Lifetime risk: 4.1 % 4.2 %            A: BCCCP exam without pap smear Complaint of left breast discharge.  P: Referred patient to the [facility] for a diagnostic mammogram. Appointment scheduled Tuesday, March 24, 2022 at 1400.  Loletta Parish, RN 03/19/2022 1:09 PM

## 2022-03-19 NOTE — Patient Instructions (Signed)
Explained breast self awareness with Frankey Poot. Patient did not need a Pap smear today due to last Pap smear was 01/10/2021. Let her know BCCCP will cover Pap smears every 3 years unless has a history of abnormal Pap smears. Referred patient to the [facility] for a diagnostic mammogram. Appointment scheduled Tuesday, March 24, 2022 at 1400. Patient aware of appointment and will be there. [place] verbalized understanding.  Merrell Borsuk, Arvil Chaco, RN 1:09 PM

## 2022-03-24 ENCOUNTER — Other Ambulatory Visit: Payer: Self-pay

## 2022-03-31 ENCOUNTER — Other Ambulatory Visit: Payer: Self-pay

## 2022-03-31 MED ORDER — LEVOTHYROXINE SODIUM 50 MCG PO TABS: 50.0000 ug | ORAL_TABLET | Freq: Every day | ORAL | 10 refills | Status: DC

## 2022-04-03 ENCOUNTER — Ambulatory Visit
Admission: RE | Admit: 2022-04-03 | Discharge: 2022-04-03 | Disposition: A | Discharge status: Home / Self Care | Payer: Self-pay | Source: Ambulatory Visit | Attending: Obstetrics and Gynecology | Admitting: Obstetrics and Gynecology

## 2022-04-03 ENCOUNTER — Ambulatory Visit
Admission: RE | Admit: 2022-04-03 | Discharge: 2022-04-03 | Disposition: A | Discharge status: Home / Self Care | Payer: No Typology Code available for payment source | Source: Ambulatory Visit | Attending: Obstetrics and Gynecology | Admitting: Obstetrics and Gynecology

## 2022-04-03 DIAGNOSIS — N6452 Nipple discharge: Secondary | ICD-10-CM

## 2022-04-06 ENCOUNTER — Other Ambulatory Visit: Payer: Self-pay | Admitting: Obstetrics and Gynecology

## 2022-04-06 DIAGNOSIS — N6452 Nipple discharge: Secondary | ICD-10-CM

## 2022-04-06 NOTE — Progress Notes (Signed)
Referral request sent to [facility].

## 2022-04-07 ENCOUNTER — Telehealth: Payer: Self-pay | Admitting: Emergency Medicine

## 2022-04-07 NOTE — Telephone Encounter (Signed)
Outgoing call to patient with interpreter to discuss breast ultrasound results. Pt informed that order for breast MRI has been placed and should anticipate a call for scheduling. Pt agrees and has no further questions or concerns.

## 2022-04-09 NOTE — Progress Notes (Signed)
7

## 2022-04-12 ENCOUNTER — Encounter: Payer: Self-pay | Admitting: Internal Medicine

## 2022-04-15 ENCOUNTER — Telehealth: Payer: Self-pay

## 2022-04-15 NOTE — Telephone Encounter (Signed)
Called patient via Sarah Massey, Sarah Massey to give [facility] appointment information. Patient is scheduled for Wednesday, May 06, 2022 @ 9:00 am. Patient is aware that BCCCP will cover the surgical consult. Patient was instructed to call back if further work-up was recommended to see what BCCCP will cover. Patient voiced understanding.

## 2022-06-11 ENCOUNTER — Telehealth: Payer: Self-pay

## 2022-06-11 NOTE — Telephone Encounter (Signed)
Patient would like an appointment for hip pain. Patient has had it for about a week. Does not recall injury as a cause. Pain is constant and has worsened over time. She is not taking any medication for this issue.

## 2022-06-18 ENCOUNTER — Ambulatory Visit
Admission: RE | Admit: 2022-06-18 | Discharge: 2022-06-18 | Disposition: A | Discharge status: Home / Self Care | Payer: No Typology Code available for payment source | Source: Ambulatory Visit | Attending: Obstetrics and Gynecology | Admitting: Obstetrics and Gynecology

## 2022-06-18 DIAGNOSIS — N6452 Nipple discharge: Secondary | ICD-10-CM

## 2022-06-18 MED ORDER — GADOBUTROL 1 MMOL/ML IV SOLN
8.0000 mL | Freq: Once | INTRAVENOUS | Status: AC | PRN
  Administered 2022-06-18: 8 mL via INTRAVENOUS

## 2022-06-19 ENCOUNTER — Other Ambulatory Visit: Payer: Self-pay

## 2022-06-19 DIAGNOSIS — E782 Mixed hyperlipidemia: Secondary | ICD-10-CM

## 2022-06-19 DIAGNOSIS — E119 Type 2 diabetes mellitus without complications: Secondary | ICD-10-CM

## 2022-06-20 LAB — COMPREHENSIVE METABOLIC PANEL
ALT: 24 IU/L (ref 0–32)
AST: 21 IU/L (ref 0–40)
Albumin/Globulin Ratio: 1.6 (ref 1.2–2.2)
Albumin: 4.7 g/dL (ref 3.8–4.9)
Alkaline Phosphatase: 83 IU/L (ref 44–121)
BUN/Creatinine Ratio: 18 (ref 9–23)
BUN: 10 mg/dL (ref 6–24)
Bilirubin Total: 0.2 mg/dL (ref 0.0–1.2)
CO2: 26 mmol/L (ref 20–29)
Calcium: 9.4 mg/dL (ref 8.7–10.2)
Chloride: 103 mmol/L (ref 96–106)
Creatinine, Ser: 0.56 mg/dL — ABNORMAL LOW (ref 0.57–1.00)
Globulin, Total: 3 g/dL (ref 1.5–4.5)
Glucose: 114 mg/dL — ABNORMAL HIGH (ref 70–99)
Potassium: 4.5 mmol/L (ref 3.5–5.2)
Sodium: 142 mmol/L (ref 134–144)
Total Protein: 7.7 g/dL (ref 6.0–8.5)
eGFR: 110 mL/min/{1.73_m2} (ref >59)

## 2022-06-20 LAB — HEMOGLOBIN A1C
Est. average glucose Bld gHb Est-mCnc: 140 mg/dL
Hgb A1c MFr Bld: 6.5 % — ABNORMAL HIGH (ref 4.8–5.6)

## 2022-06-20 LAB — MICROALBUMIN / CREATININE URINE RATIO
Creatinine, Urine: 47.4 mg/dL
Microalb/Creat Ratio: 13 mg/g creat (ref 0–29)
Microalbumin, Urine: 6 ug/mL

## 2022-06-20 LAB — LIPID PANEL W/O CHOL/HDL RATIO
Cholesterol, Total: 235 mg/dL — ABNORMAL HIGH (ref 100–199)
HDL: 44 mg/dL (ref >39)
LDL Chol Calc (NIH): 161 mg/dL — ABNORMAL HIGH (ref 0–99)
Triglycerides: 166 mg/dL — ABNORMAL HIGH (ref 0–149)
VLDL Cholesterol Cal: 30 mg/dL (ref 5–40)

## 2022-06-23 NOTE — Telephone Encounter (Signed)
Patient has been scheduled

## 2022-06-24 ENCOUNTER — Other Ambulatory Visit: Payer: Self-pay | Admitting: Internal Medicine

## 2022-06-24 ENCOUNTER — Encounter: Payer: Self-pay | Admitting: Internal Medicine

## 2022-06-24 ENCOUNTER — Ambulatory Visit: Payer: Self-pay | Admitting: Internal Medicine

## 2022-06-24 VITALS — BP 130/78 | HR 72 | Resp 16 | Ht 60.0 in | Wt 173.0 lb

## 2022-06-24 DIAGNOSIS — E119 Type 2 diabetes mellitus without complications: Secondary | ICD-10-CM

## 2022-06-24 DIAGNOSIS — E049 Nontoxic goiter, unspecified: Secondary | ICD-10-CM

## 2022-06-24 DIAGNOSIS — H43393 Other vitreous opacities, bilateral: Secondary | ICD-10-CM

## 2022-06-24 DIAGNOSIS — Z0000 Encounter for general adult medical examination without abnormal findings: Secondary | ICD-10-CM

## 2022-06-24 DIAGNOSIS — Z23 Encounter for immunization: Secondary | ICD-10-CM

## 2022-06-24 DIAGNOSIS — E782 Mixed hyperlipidemia: Secondary | ICD-10-CM

## 2022-06-24 DIAGNOSIS — E039 Hypothyroidism, unspecified: Secondary | ICD-10-CM

## 2022-06-24 DIAGNOSIS — Z6834 Body mass index (BMI) 34.0-34.9, adult: Secondary | ICD-10-CM

## 2022-06-24 DIAGNOSIS — E669 Obesity, unspecified: Secondary | ICD-10-CM

## 2022-06-24 MED ORDER — ATORVASTATIN CALCIUM 20 MG PO TABS: ORAL_TABLET | ORAL | 11 refills | Status: DC

## 2022-06-24 NOTE — Progress Notes (Signed)
Subjective:    Patient ID: Sarah Massey, female   DOB: [birth year], 51 y.o.   MRN: [id number]   HPI  CPE without pap  1.  Pap:  Last 01/10/2021 and normal.    2.  Mammogram:  Bilateral mammogram in July of this year minimal duct ectasia beneath nipple of left breast.  As she has spontaneous white nipple discharge, had MR of breast on the 5th that does not show evidence of malignancy, which I shared with her today as she is very anxious.  No family history of breast cancer.   3.  Osteoprevention:  Takes calcium 500 mg with Vitamin D once daily. .  2 servings of milk/yogurt.  Zumba 5 days weekly for 1 hour.     4.  Guaiac Cards/FIT:  Last checked 09/2020 and negative for blood.   5.  Colonoscopy:  Never.  No family history of colon cancer.   6.  Immunizations:  Has not had influenza vaccine this year.  States the last couple of influenza vaccines, has has asthma exacerbation following and no longer wants.    7.  Glucose/Cholesterol :  A!C up a bit, though still at goal for DM at 6.5%.  Cholesterol remains too high. Lipid Panel     Component Value Date/Time   CHOL 235 (H) 06/19/2022 0858   TRIG 166 (H) 06/19/2022 0858   HDL 44 06/19/2022 0858   CHOLHDL 3.8 Ratio 02/27/2010 2044   VLDL 40 02/27/2010 2044   LDLCALC 161 (H) 06/19/2022 0858   LABVLDL 30 06/19/2022 0858     Current Meds  Medication Sig   AgaMatrix Ultra-Thin Lancets MISC Check blood sugar twice daily before meals   albuterol (PROVENTIL HFA;VENTOLIN HFA) 108 (90 Base) MCG/ACT inhaler Inhale 1-2 puffs into the lungs every 6 (six) hours as needed for wheezing or shortness of breath.   Blood Glucose Monitoring Suppl (AGAMATRIX PRESTO) w/Device KIT Check blood sugar twice daily before meals   cetirizine (ZYRTEC) 10 MG tablet Take 10 mg by mouth daily.   fluticasone (FLOVENT HFA) 110 MCG/ACT inhaler 1 puff twice daily followed by brushing teeth and tongue   glucose blood (AGAMATRIX PRESTO TEST) test strip  Check blood sugar twice daily before meals   levothyroxine (EUTHYROX) 50 MCG tablet Take 1 tablet (50 mcg total) by mouth daily before breakfast.   metFORMIN (GLUCOPHAGE-XR) 500 MG 24 hr tablet 1 tab by mouth twice daily with meals   SUMAtriptan (IMITREX) 50 MG tablet 1 tab by mouth for headache May repeat in 2 hours if headache persists or recurs.  Actually, has not used asthma inhalers for 10 months to a year. Has Flovent and albuterol at home should she need them.    No Known Allergies  Past Medical History:  Diagnosis Date   Asthma    Asthma 01/25/2012   Depression    pt is depressed about her current pregnancy   DM type 2 (diabetes mellitus, type 2) ([place])    Fibroid    Gestational diabetes    previous pregnancy   Hypothyroidism    Obesity    PONV (postoperative nausea and vomiting)    Postmenopausal bleeding 03/24/2021   Ultrasound with [facility] showed stable uterine fibroid and right ovarian dermoid cyst. Likely due to cervical polyp   Urinary tract infection    Past Surgical History:  Procedure Laterality Date   CESAREAN SECTION  2005   DILATION AND CURETTAGE OF UTERUS  2007   Family History  Problem  Relation Age of Onset   Thyroid disease Father    Hypertension Mother    Diabetes Mother    Heart disease Mother        MI cause of death   Fibroids Sister    Thyroid disease Sister    Early death Maternal Grandfather    Thyroid disease Sister    GER disease Son    Anesthesia problems Neg Hx    Social History   Socioeconomic History   Marital status: Married    Spouse name: Sonia Baller   Number of children: 6   Years of education: 3   Highest education level: 3rd grade  Occupational History   Occupation: Careers information officer  Tobacco Use   Smoking status: Never   Smokeless tobacco: Never  Vaping Use   Vaping Use: Never used  Substance and Sexual Activity   Alcohol use: No   Drug use: No   Sexual activity: Yes    Birth control/protection: Condom    Comment:  desires nexplanon  Other Topics Concern   Not on file  Social History Narrative   Her 3 oldest children live on their own with their partners   She lives at home with her 3 youngest children and her husband      Social Determinants of Health   Financial Resource Strain: [place]  (08/22/2020)   Overall Financial Resource Strain (CARDIA)    Difficulty of Paying Living Expenses: Not hard at all  Food Insecurity: No [place] (03/19/2022)   Hunger Vital Sign    Worried About Running Out of Food in the Last Year: Never true    Ran Out of Food in the Last Year: Never true  Transportation Needs: No Transportation Needs (03/19/2022)   PRAPARE - Hydrologist (Medical): No    Lack of Transportation (Non-Medical): No  Physical Activity: Sufficiently Active (02/03/2018)   Exercise Vital Sign    Days of Exercise per Week: 7 days    Minutes of Exercise per Session: 60 min  Stress: Not on file  Social Connections: Unknown (02/03/2018)   Social Connection and Isolation Panel [NHANES]    Frequency of Communication with Friends and Family: More than three times a week    Frequency of Social Gatherings with Friends and Family: More than three times a week    Attends Religious Services: More than 4 times per year    Active Member of [employer] or Organizations: Not on file    Attends Archivist Meetings: Not on file    Marital Status: Not on file  Intimate Partner Violence: Not At Risk (08/22/2020)   Humiliation, Afraid, Rape, and Kick questionnaire    Fear of Current or Ex-Partner: No    Emotionally Abused: No    Physically Abused: No    Sexually Abused: No     Review of Systems  HENT:  Negative for dental problem (Has appt for dental already--doing fillings.).   Eyes:  Positive for visual disturbance (Describes dark floaters for past 2 years and now seems to be worsening.  Has not had diabetic eye check this past year.).  Respiratory:  Negative for  shortness of breath and wheezing (Has not needed rescue inhaler for one year nor Flovent for about 10 months.).   Cardiovascular:  Negative for chest pain, palpitations and leg swelling.  Genitourinary:  Negative for vaginal bleeding.  Neurological:  Negative for weakness and numbness.  Psychiatric/Behavioral:  Negative for dysphoric mood. The patient is  not nervous/anxious.       Objective:   BP 130/78 (BP Location: Right Arm, Patient Position: Sitting, Cuff Size: Normal)   Pulse 72   Resp 16   Ht 5' (1.524 m)   Wt 173 lb (78.5 kg)   LMP 08/14/2018 (Approximate)   BMI 33.79 kg/m   Physical Exam Constitutional:      Appearance: She is obese.  HENT:     Head: Normocephalic and atraumatic.     Right Ear: Tympanic membrane, ear canal and external ear normal.     Left Ear: Tympanic membrane, ear canal and external ear normal.     Nose: Nose normal.     Mouth/Throat:     Mouth: Mucous membranes are dry.     Pharynx: Oropharynx is clear.  Eyes:     Extraocular Movements: Extraocular movements intact.     Conjunctiva/sclera: Conjunctivae normal.     Pupils: Pupils are equal, round, and reactive to light.     Comments: Discs sharp.  Neck:     Thyroid: Thyromegaly (Diffusely nodular) present.  Cardiovascular:     Rate and Rhythm: Normal rate and regular rhythm.     Pulses:          Dorsalis pedis pulses are 2+ on the right side and 2+ on the left side.       Posterior tibial pulses are 2+ on the right side and 2+ on the left side.     Heart sounds: S1 normal and S2 normal. No murmur heard.    No friction rub. No S3 or S4 sounds.     Comments: No carotid bruits.  Carotid, radial, femoral, DP and PT pulses normal and equal.    LE varicosities Pulmonary:     Effort: Pulmonary effort is normal.     Breath sounds: Normal breath sounds and air entry.  Chest:  Breasts:    Right: Inverted nipple present. No mass or nipple discharge.     Left: Inverted nipple present. No mass or  nipple discharge.  Abdominal:     General: Bowel sounds are normal.     Palpations: Abdomen is soft. There is no hepatomegaly, splenomegaly or mass.     Tenderness: There is no abdominal tenderness.     Hernia: No hernia is present.  Genitourinary:    General: Normal vulva.     Comments: No uterine or adnexal mass or tenderness.  Musculoskeletal:        General: Normal range of motion.     Cervical back: Normal range of motion and neck supple.     Right lower leg: No edema.     Left lower leg: No edema.  Feet:     Right foot:     Protective Sensation: 10 sites tested.  10 sites sensed.     Skin integrity: Skin integrity normal.     Toenail Condition: Fungal disease present.    Left foot:     Protective Sensation: 10 sites tested.  10 sites sensed.     Skin integrity: Skin integrity normal.  Lymphadenopathy:     Head:     Right side of head: No submental or submandibular adenopathy.     Left side of head: No submental or submandibular adenopathy.     Cervical: No cervical adenopathy.     Upper Body:     Right upper body: No supraclavicular or axillary adenopathy.     Left upper body: No supraclavicular or axillary adenopathy.  Lower Body: No right inguinal adenopathy. No left inguinal adenopathy.  Skin:    General: Skin is warm.     Capillary Refill: Capillary refill takes less than 2 seconds.  Neurological:     General: No focal deficit present.     Mental Status: She is alert and oriented to person, place, and time.     Cranial Nerves: Cranial nerves 2-12 are intact.     Sensory: Sensation is intact.     Motor: Motor function is intact.     Coordination: Coordination is intact.     Gait: Gait is intact.     Deep Tendon Reflexes: Reflexes are normal and symmetric.  Psychiatric:        Speech: Speech normal.        Behavior: Behavior normal. Behavior is cooperative.      Assessment & Plan    CPE without pap Mammogram/Breast evaluation likely complete, but await  Dr Marlowe Aschoff recommendations following MR of left breast.   Shingrix #2/2 Pneumococcal 23 Refuses influenza as may have allergy. Spikevax in another week when returns FIT  2.  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  MR without concern.  Await final recommendations from Dr. Harriett Rush call if patient does not hear from her office.   3.  DM:  A1C at goal.  Eye referral.    4.  Hyperlipidemia:  Just not getting cholesterol to goal.  Willing to finally try statin.  Atorvastatin 20 mg daily with repeat FLP, hepatic profile in 6 weeks.    5  Increasing floaters:  eye referral.    6.  Nodular goiter with hypothyroidism:  TSH normal earlier in year.  Thyroid ultrasound to evaluate nodules.

## 2022-07-09 ENCOUNTER — Telehealth: Payer: Self-pay

## 2022-07-09 NOTE — Telephone Encounter (Signed)
Via Lavon Paganini, Spanish Interpreter [facility]) Patient informed [facility] referral appointment is 09/01/2022 @ 2:30 pm, arrive at 2:15 pm, address provided. Patient verbalized understanding.

## 2022-07-10 ENCOUNTER — Ambulatory Visit: Payer: Self-pay | Admitting: Internal Medicine

## 2022-07-10 DIAGNOSIS — Z23 Encounter for immunization: Secondary | ICD-10-CM

## 2022-07-10 NOTE — Progress Notes (Signed)
Here for Spikevax and to drop off FIT Right deltoid

## 2022-07-21 ENCOUNTER — Other Ambulatory Visit: Payer: Self-pay

## 2022-07-21 DIAGNOSIS — Z1211 Encounter for screening for malignant neoplasm of colon: Secondary | ICD-10-CM

## 2022-07-21 LAB — POC FIT TEST STOOL: Fecal Occult Blood: NEGATIVE

## 2022-07-31 ENCOUNTER — Other Ambulatory Visit: Payer: Self-pay

## 2022-07-31 DIAGNOSIS — E782 Mixed hyperlipidemia: Secondary | ICD-10-CM

## 2022-08-01 LAB — LIPID PANEL W/O CHOL/HDL RATIO
Cholesterol, Total: 175 mg/dL (ref 100–199)
HDL: 41 mg/dL (ref >39)
LDL Chol Calc (NIH): 105 mg/dL — ABNORMAL HIGH (ref 0–99)
Triglycerides: 166 mg/dL — ABNORMAL HIGH (ref 0–149)
VLDL Cholesterol Cal: 29 mg/dL (ref 5–40)

## 2022-08-01 LAB — HEPATIC FUNCTION PANEL
ALT: 23 IU/L (ref 0–32)
AST: 22 IU/L (ref 0–40)
Albumin: 4.6 g/dL (ref 3.8–4.9)
Alkaline Phosphatase: 80 IU/L (ref 44–121)
Bilirubin Total: 0.3 mg/dL (ref 0.0–1.2)
Bilirubin, Direct: 0.1 mg/dL (ref 0.00–0.40)
Total Protein: 7.6 g/dL (ref 6.0–8.5)

## 2022-08-11 ENCOUNTER — Other Ambulatory Visit: Payer: Self-pay

## 2022-12-24 ENCOUNTER — Encounter: Payer: Self-pay | Admitting: Internal Medicine

## 2022-12-24 ENCOUNTER — Ambulatory Visit: Payer: Self-pay | Admitting: Internal Medicine

## 2022-12-24 VITALS — BP 118/80 | HR 64 | Resp 12 | Ht 60.0 in | Wt 169.0 lb

## 2022-12-24 DIAGNOSIS — N6452 Nipple discharge: Secondary | ICD-10-CM

## 2022-12-24 DIAGNOSIS — E119 Type 2 diabetes mellitus without complications: Secondary | ICD-10-CM

## 2022-12-24 DIAGNOSIS — E782 Mixed hyperlipidemia: Secondary | ICD-10-CM

## 2022-12-24 DIAGNOSIS — E049 Nontoxic goiter, unspecified: Secondary | ICD-10-CM

## 2022-12-24 NOTE — Progress Notes (Signed)
Subjective:    Patient ID: Sarah Massey, female   DOB: 1970/12/03, 52 y.o.   MRN: [id number]   HPI  Sarah Massey interprets  Left nipple discharge with ductal ectasia on mammogram and normal MR:  was to follow up with Dr. Donell Beers, surgery, but cancelled appt as she was receiving bills from [facility] repeatedly despite having [facility] coverage.  She was afraid of incurring further debt.  She reportedly has spoken with [facility], but has not heard back yet.  Was to follow up back in December for MR in October.   She continues to have some spontaneous white nipple discharge with an odor.   Spoke with [facility] MR division, [place], with patient in room.   [facility] apparently planned to cover, but for some reason that has not happened She will have supervisor, Maralyn Sago, call to clarify with [place] and with [facility].  2.  DM:  Last A1C was 6.5% in October.    3.  Hyperlipidemia:  Did not take Atorvastatin long as she did not feel well with them with headache, palpitations.  She decided to take a natural alternatives:  She is drinking juices made from oatmeal, green apple, celery, cucumber--she does not filter and leaves skin on veggies/fruits.  She is also taking omega 3 FA as well.  She cannot say how many mg she is taking.  Her cholesterol was not at goal in November, though much improved.  LDL was still high at 105.  She had only taken the med for 1 month.   Lipid Panel     Component Value Date/Time   CHOL 175 07/31/2022 0855   TRIG 166 (H) 07/31/2022 0855   HDL 41 07/31/2022 0855   CHOLHDL 3.8 Ratio 02/27/2010 2044   VLDL 40 02/27/2010 2044   LDLCALC 105 (H) 07/31/2022 0855   LABVLDL 29 07/31/2022 0855   4.  Nodular goiter:  has not had [place] of thyroid yet.  [facility].    Current Meds  Medication Sig   AgaMatrix Ultra-Thin Lancets MISC Check blood sugar twice daily before meals   albuterol (PROVENTIL HFA;VENTOLIN HFA) 108 (90 Base) MCG/ACT inhaler Inhale 1-2 puffs into the lungs every  6 (six) hours as needed for wheezing or shortness of breath.   Blood Glucose Monitoring Suppl (AGAMATRIX PRESTO) w/Device KIT Check blood sugar twice daily before meals   cetirizine (ZYRTEC) 10 MG tablet Take 10 mg by mouth daily.   fluticasone (FLOVENT HFA) 110 MCG/ACT inhaler 1 puff twice daily followed by brushing teeth and tongue (Patient taking differently: as needed. 1 puff twice daily followed by brushing teeth and tongue)   glucose blood (AGAMATRIX PRESTO TEST) test strip Check blood sugar twice daily before meals   levothyroxine (EUTHYROX) 50 MCG tablet Take 1 tablet (50 mcg total) by mouth daily before breakfast.   metFORMIN (GLUCOPHAGE-XR) 500 MG 24 hr tablet TAKE 1 TABLET BY MOUTH TWICE DAILY WITH MEALS   Allergies  Allergen Reactions   Flublok [Influenza Vaccine Recombinant] Shortness Of Breath     Review of Systems    Objective:   BP 118/80 (BP Location: Left Arm, Patient Position: Sitting, Cuff Size: Normal)   Pulse 64   Resp 12   Ht 5' (1.524 m)   Wt 169 lb (76.7 kg)   LMP 08/14/2018 (Approximate)   BMI 33.01 kg/m   Physical Exam NAD HEENT:  PERRL, EOMI Neck:  Supple, Nodular goiter Chest:  CTA Breasts:  inverted nipple on left.  Unable to express discharge today with mild pressure. CV:  RRR without murmur or rub.  Radial pulses normal and equal LE:  No edema.     Assessment & Plan   Left nipple discharge and subaerolar ductal ectasia on mammogram in 2023 with normal MR of breast:  telephone message back to [facility], LPN to get her back in with [facility]/Dr. Donell Beers to follow up and also as she continues to have discharge with odor.  Email to [employer] and spoke with [place] at [employer].  Supervisor at [employer], Maralyn Sago, will call back to clarify coverage from [employer].    2.  DM:  recheck A1C  3.  Hyperlipidemia;  while applaud work on lifestyle change with diet, encouraged patient to call when she stops medication.  Also discussed she had tried working  on lifestyle for over a year without getting cholesterol to goal.  Discussed will recommend another med, Rosuvastatin if her cholesterol remains high.  FLP    4.  Nodular goiter:  checking into why she has not had thyroid US yet.  Her TSH was fine at last visit.

## 2022-12-24 NOTE — Telephone Encounter (Signed)
Yes, I will verify everything with Sarah Massey.

## 2022-12-25 LAB — LIPID PANEL W/O CHOL/HDL RATIO
Cholesterol, Total: 207 mg/dL — ABNORMAL HIGH (ref 100–199)
HDL: 48 mg/dL (ref >39)
LDL Chol Calc (NIH): 136 mg/dL — ABNORMAL HIGH (ref 0–99)
Triglycerides: 128 mg/dL (ref 0–149)
VLDL Cholesterol Cal: 23 mg/dL (ref 5–40)

## 2022-12-25 LAB — HGB A1C W/O EAG: Hgb A1c MFr Bld: 6.4 % — ABNORMAL HIGH (ref 4.8–5.6)

## 2022-12-29 ENCOUNTER — Other Ambulatory Visit: Payer: Self-pay

## 2022-12-29 MED ORDER — LEVOTHYROXINE SODIUM 50 MCG PO TABS: 50.0000 ug | ORAL_TABLET | Freq: Every day | ORAL | 11 refills | Status: DC

## 2022-12-30 ENCOUNTER — Telehealth: Payer: Self-pay

## 2022-12-30 NOTE — Telephone Encounter (Signed)
Via Delorise Royals, Spanish Interpreter/[place], Patient was informed [facility] appointment is Jan 18, 2023 at 4 :00 pm with a 3:45 pm arrival time (for breast discharge). Patient verbalized understanding.

## 2023-01-20 ENCOUNTER — Other Ambulatory Visit: Payer: Self-pay | Admitting: Obstetrics and Gynecology

## 2023-01-20 DIAGNOSIS — Z1231 Encounter for screening mammogram for malignant neoplasm of breast: Secondary | ICD-10-CM

## 2023-02-15 ENCOUNTER — Telehealth: Payer: Self-pay | Admitting: Internal Medicine

## 2023-02-15 NOTE — Telephone Encounter (Signed)
Patient notified of US results

## 2023-02-25 ENCOUNTER — Ambulatory Visit
Admission: RE | Admit: 2023-02-25 | Discharge: 2023-02-25 | Disposition: A | Discharge status: Home / Self Care | Payer: No Typology Code available for payment source | Source: Ambulatory Visit | Attending: Obstetrics and Gynecology | Admitting: Obstetrics and Gynecology

## 2023-02-25 ENCOUNTER — Ambulatory Visit: Payer: Self-pay | Admitting: Hematology and Oncology

## 2023-02-25 ENCOUNTER — Telehealth: Payer: Self-pay

## 2023-02-25 VITALS — BP 131/74 | Wt 169.9 lb

## 2023-02-25 DIAGNOSIS — Z1231 Encounter for screening mammogram for malignant neoplasm of breast: Secondary | ICD-10-CM

## 2023-02-25 DIAGNOSIS — Z1211 Encounter for screening for malignant neoplasm of colon: Secondary | ICD-10-CM

## 2023-02-25 NOTE — Progress Notes (Signed)
Ms. Taytum Scheck is a 52 y.o. female who presents to [facility] clinic today with complaint of clear white discharge from left breast.  Pap Smear: Pap not smear completed today. Last Pap smear was 01/10/2021 was negative with negative HPV. Per patient has no history of an abnormal Pap smear. Last Pap smear result is available in Epic.   Physical exam: Breasts Breasts symmetrical. No skin abnormalities bilateral breasts. No nipple retraction bilateral breasts. No discharge bilateral breasts. No lymphadenopathy. No lumps palpated bilateral breasts.       Pelvic/Bimanual Pap is not indicated today    Smoking History: Patient has never smoked Not referred to quit line.    Patient Navigation: Patient education provided. Access to services provided for patient through BCCCP program. Natale Lay interpreter provided. No transportation provided   Colorectal Cancer Screening: Per patient has never had colonoscopy completed. Provided FIT test to be completed and mailed. No complaints today.    Breast and Cervical Cancer Risk Assessment: Patient does not have family history of breast cancer, known genetic mutations, or radiation treatment to the chest before age 46. Patient does not have history of cervical dysplasia, immunocompromised, or DES exposure in-utero.  Risk Scores as of 02/25/2023     Dondra Spry           5-year 0.76 %   Lifetime 6.31 %   This patient is [place]/Latina but has no documented birth country, so the [place] model used data from [place] patients to calculate their risk score. Document a birth country in the Demographics activity for a more accurate score.         Last calculated by Caprice Red, CMA on 02/25/2023 at 10:30 AM        A: BCCCP exam without pap smear Complaints of white discharge at left breast.  P: Referred patient to the [facility] for a screening mammogram. Appointment scheduled 6/13/204 at 1120 am.  Joette Catching,  RN 02/25/2023 10:42 AM

## 2023-02-25 NOTE — Telephone Encounter (Signed)
Telephoned patient using language line interpreter [phone]. Left voice message with BCCCP contact information.

## 2023-02-25 NOTE — Patient Instructions (Signed)
Taught Sarah Massey about self breast awareness and gave educational materials to take home. Patient did not need a Pap smear today due to last Pap smear was in 01/10/21 per patient. Let her know BCCCP will cover Pap smears every 5 years unless has a history of abnormal Pap smears. Referred patient to the [facility] for screening mammogram. Appointment scheduled for 02/25/23. Patient aware of appointment and will be there. Let patient know will follow up with her within the next couple weeks with results. Dmiyah Liscano Calderon verbalized understanding.  Pascal Lux, NP 10:51 AM

## 2023-02-27 LAB — FECAL OCCULT BLOOD, IMMUNOCHEMICAL: Fecal Occult Bld: NEGATIVE

## 2023-03-10 NOTE — Telephone Encounter (Signed)
Patient would like an appointment for blurry vision. Patient was seen by a specialist earlier this year and now has glasses. Glasses helped at first, but recently feels that her vision has gotten blurry again. Patient has been placed on wait list.

## 2023-04-15 ENCOUNTER — Ambulatory Visit
Admission: RE | Admit: 2023-04-15 | Discharge: 2023-04-15 | Disposition: A | Discharge status: Home / Self Care | Payer: No Typology Code available for payment source | Source: Ambulatory Visit | Attending: Obstetrics and Gynecology | Admitting: Obstetrics and Gynecology

## 2023-04-15 ENCOUNTER — Ambulatory Visit: Payer: Self-pay | Admitting: Hematology and Oncology

## 2023-04-15 VITALS — BP 145/83 | Wt 168.0 lb

## 2023-04-15 DIAGNOSIS — Z1239 Encounter for other screening for malignant neoplasm of breast: Secondary | ICD-10-CM

## 2023-04-15 NOTE — Progress Notes (Signed)
[name] is a 52 y.o. female who presents to [facility] clinic today with no complaints.    Pap Smear: Pap not smear completed today. Last Pap smear was 01/10/2021 and was normal. Per patient has no history of an abnormal Pap smear. Last Pap smear result is available in Epic.   Physical exam: Breasts Breasts symmetrical. No skin abnormalities bilateral breasts. No nipple retraction bilateral breasts. No nipple discharge bilateral breasts. No lymphadenopathy. No lumps palpated bilateral breasts.  MS DIGITAL DIAG TOMO BILAT  Result Date: 04/03/2022 CLINICAL DATA:  Patient describes unilateral LEFT-sided nipple discharge which patient states is spontaneous and white in color. EXAM: DIGITAL DIAGNOSTIC BILATERAL MAMMOGRAM WITH TOMOSYNTHESIS AND CAD; ULTRASOUND LEFT BREAST LIMITED TECHNIQUE: Bilateral digital diagnostic mammography and breast tomosynthesis was performed. The images were evaluated with computer-aided detection.; Targeted ultrasound examination of the left breast was performed. COMPARISON:  Previous exam(s). ACR Breast Density Category b: There are scattered areas of fibroglandular density. FINDINGS: There are no new dominant masses, suspicious calcifications or secondary signs of malignancy within either breast. Specifically, there is no mammographic abnormality within the subareolar or periareolar LEFT breast corresponding to the area of clinical concern. Targeted ultrasound is performed, showing minimal duct ectasia within the subareolar LEFT breast, without intraductal mass or vascularity. No solid or cystic mass is identified within the subareolar or periareolar LEFT breast. IMPRESSION: 1. No evidence of malignancy within either breast. 2. Minimal duct ectasia within the subareolar LEFT breast, possible correlate for the LEFT-sided nipple discharge which patient states is spontaneous and white in color. 3. Causes of unilateral nipple discharge include: Hormonal changes,  fibrocystic changes, benign papilloma, abscess/mastitis, birth control pills, endocrine disorders, injury/trauma to breast, duct ectasia, medications, prolactinoma, and breast cancer. As is evident from this list, nipple discharge often stems from a benign condition, however, breast cancer is a possibility when unilateral spontaneous persistent single duct discharge (especially bloody or clear discharge) is present. RECOMMENDATION: 1. Breast MRI with contrast for further characterization of patient's nipple discharge. Recommendation for breast MRI based on data provided by the [employer] of Radiology Appropriateness Criteria, produced by an expert panel on breast imaging, showing breast MRI with contrast to have a high sensitivity and NPV for identifying the cause of pathologic nipple discharge. 2. Breast surgery consultation may be eventually required as central ductal excision might become necessary for definitive diagnosis and/or definitive therapy. I have discussed the findings and recommendations with the patient with the aid of an interpreter. If applicable, a reminder letter will be sent to the patient regarding the next appointment. BI-RADS CATEGORY  2: Benign. However, breast MRI is recommended for the LEFT-sided nipple discharge, as detailed above. Electronically Signed   By: Bary Richard M.D.   On: 04/03/2022 15:48  MS DIGITAL DIAG TOMO UNI LEFT  Result Date: 12/19/2020 CLINICAL DATA:  The patient was called back for left breast asymmetry. EXAM: DIGITAL DIAGNOSTIC UNILATERAL LEFT MAMMOGRAM WITH TOMOSYNTHESIS AND CAD TECHNIQUE: Left digital diagnostic mammography and breast tomosynthesis was performed. The images were evaluated with computer-aided detection. COMPARISON:  Previous exam(s). ACR Breast Density Category b: There are scattered areas of fibroglandular density. FINDINGS: The left breast asymmetry resolves on today's imaging. IMPRESSION: No mammographic evidence of malignancy.  RECOMMENDATION: Annual screening mammography. I have discussed the findings and recommendations with the patient. If applicable, a reminder letter will be sent to the patient regarding the next appointment. BI-RADS CATEGORY  1: Negative. Electronically Signed   By: Gerome Sam III M.D  On: 12/19/2020 10:46   MS DIGITAL SCREENING TOMO BILATERAL  Result Date: 11/29/2020 CLINICAL DATA:  Screening. EXAM: DIGITAL SCREENING BILATERAL MAMMOGRAM WITH TOMOSYNTHESIS AND CAD TECHNIQUE: Bilateral screening digital craniocaudal and mediolateral oblique mammograms were obtained. Bilateral screening digital breast tomosynthesis was performed. The images were evaluated with computer-aided detection. COMPARISON:  Previous exam(s). ACR Breast Density Category b: There are scattered areas of fibroglandular density. FINDINGS: In the left breast, a possible asymmetry warrants further evaluation. In the right breast, no findings suspicious for malignancy. IMPRESSION: Further evaluation is suggested for possible asymmetry in the left breast. RECOMMENDATION: Diagnostic mammogram and possibly ultrasound of the left breast. (Code:[id number]) The patient will be contacted regarding the findings, and additional imaging will be scheduled. BI-RADS CATEGORY  0: Incomplete. Need additional imaging evaluation and/or prior mammograms for comparison. Electronically Signed   By: Elberta Fortis M.D.   On: 11/29/2020 13:22         Pelvic/Bimanual Pap is not indicated today    Smoking History: Patient has never smoked and was not referred to quit line.    Patient Navigation: Patient education provided. Access to services provided for patient through B[facility]CP program. Tobey Grim interpreter provided. No transportation provided   Colorectal Cancer Screening: Per patient has never had colonoscopy completed No complaints today. FIT test completed June 2024 and was negative.     Breast and Cervical Cancer Risk Assessment: Patient does  not have family history of breast cancer, known genetic mutations, or radiation treatment to the chest before age 66. Patient does not have history of cervical dysplasia, immunocompromised, or DES exposure in-utero.  Risk Scores as of Encounter on 04/15/2023     Dondra Spry           5-year 0.76%   Lifetime 6.31%   This patient is [place] but has no documented birth country, so the [facility] model used data from [place] patients to calculate their risk score. Document a birth country in the Demographics activity for a more accurate score.         Last calculated by Caprice Red, CMA on 04/15/2023 at 10:45 AM        A: B[facility]CP exam without pap smear No complaints with benign exam.  P: Referred patient to the [facility] for a screening mammogram. Appointment scheduled 04/15/2023.  Pascal Lux, NP 04/15/2023 10:56 AM

## 2023-04-15 NOTE — Patient Instructions (Signed)
Taught Kayori Holzmann about self breast awareness and gave educational materials to take home. Patient did not need a Pap smear today due to last Pap smear was in 01/10/21 per patient.  Let her know [facility]CP will cover Pap smears every 5 years unless has a history of abnormal Pap smears. Referred patient to the [facility] for screening mammogram. Appointment scheduled for 04/15/2023. Patient aware of appointment and will be there. Let patient know will follow up with her within the next couple weeks with results. Evy Servin Calderon verbalized understanding.  Pascal Lux, NP 10:59 AM

## 2023-04-23 ENCOUNTER — Other Ambulatory Visit: Payer: Self-pay | Admitting: Internal Medicine

## 2023-04-23 ENCOUNTER — Other Ambulatory Visit: Payer: Self-pay

## 2023-06-25 ENCOUNTER — Other Ambulatory Visit: Payer: Self-pay

## 2023-06-25 DIAGNOSIS — Z79899 Other long term (current) drug therapy: Secondary | ICD-10-CM

## 2023-06-25 DIAGNOSIS — E039 Hypothyroidism, unspecified: Secondary | ICD-10-CM

## 2023-06-25 DIAGNOSIS — E119 Type 2 diabetes mellitus without complications: Secondary | ICD-10-CM

## 2023-06-25 DIAGNOSIS — E782 Mixed hyperlipidemia: Secondary | ICD-10-CM

## 2023-06-26 LAB — CBC WITH DIFFERENTIAL/PLATELET
Basophils Absolute: 0 10*3/uL (ref 0.0–0.2)
Basos: 1 %
EOS (ABSOLUTE): 0.2 10*3/uL (ref 0.0–0.4)
Eos: 4 %
Hematocrit: 40.6 % (ref 34.0–46.6)
Hemoglobin: 13.1 g/dL (ref 11.1–15.9)
Immature Grans (Abs): 0 10*3/uL (ref 0.0–0.1)
Immature Granulocytes: 0 %
Lymphocytes Absolute: 3 10*3/uL (ref 0.7–3.1)
Lymphs: 49 %
MCH: 30.4 pg (ref 26.6–33.0)
MCHC: 32.3 g/dL (ref 31.5–35.7)
MCV: 94 fL (ref 79–97)
Monocytes Absolute: 0.4 10*3/uL (ref 0.1–0.9)
Monocytes: 7 %
Neutrophils Absolute: 2.4 10*3/uL (ref 1.4–7.0)
Neutrophils: 39 %
Platelets: 198 10*3/uL (ref 150–450)
RBC: 4.31 x10E6/uL (ref 3.77–5.28)
RDW: 13.2 % (ref 11.7–15.4)
WBC: 6.1 10*3/uL (ref 3.4–10.8)

## 2023-06-26 LAB — COMPREHENSIVE METABOLIC PANEL
ALT: 20 [IU]/L (ref 0–32)
AST: 21 [IU]/L (ref 0–40)
Albumin: 4.5 g/dL (ref 3.8–4.9)
Alkaline Phosphatase: 80 [IU]/L (ref 44–121)
BUN/Creatinine Ratio: 16 (ref 9–23)
BUN: 9 mg/dL (ref 6–24)
Bilirubin Total: 0.3 mg/dL (ref 0.0–1.2)
CO2: 25 mmol/L (ref 20–29)
Calcium: 9.6 mg/dL (ref 8.7–10.2)
Chloride: 101 mmol/L (ref 96–106)
Creatinine, Ser: 0.55 mg/dL — ABNORMAL LOW (ref 0.57–1.00)
Globulin, Total: 3.2 g/dL (ref 1.5–4.5)
Glucose: 98 mg/dL (ref 70–99)
Potassium: 4.9 mmol/L (ref 3.5–5.2)
Sodium: 140 mmol/L (ref 134–144)
Total Protein: 7.7 g/dL (ref 6.0–8.5)
eGFR: 110 mL/min/{1.73_m2} (ref >59)

## 2023-06-26 LAB — HEMOGLOBIN A1C
Est. average glucose Bld gHb Est-mCnc: 128 mg/dL
Hgb A1c MFr Bld: 6.1 % — ABNORMAL HIGH (ref 4.8–5.6)

## 2023-06-26 LAB — LIPID PANEL W/O CHOL/HDL RATIO
Cholesterol, Total: 222 mg/dL — ABNORMAL HIGH (ref 100–199)
HDL: 42 mg/dL (ref >39)
LDL Chol Calc (NIH): 155 mg/dL — ABNORMAL HIGH (ref 0–99)
Triglycerides: 140 mg/dL (ref 0–149)
VLDL Cholesterol Cal: 25 mg/dL (ref 5–40)

## 2023-06-26 LAB — MICROALBUMIN / CREATININE URINE RATIO
Creatinine, Urine: 14.8 mg/dL
Microalb/Creat Ratio: <20 mg/g{creat} (ref 0–29)
Microalbumin, Urine: <3 ug/mL

## 2023-06-26 LAB — TSH: TSH: 3.56 u[IU]/mL (ref 0.450–4.500)

## 2023-06-29 ENCOUNTER — Encounter: Payer: Self-pay | Admitting: Internal Medicine

## 2023-06-29 ENCOUNTER — Ambulatory Visit: Payer: Self-pay | Admitting: Internal Medicine

## 2023-06-29 VITALS — BP 128/80 | HR 74 | Resp 16 | Ht 60.0 in | Wt 166.0 lb

## 2023-06-29 DIAGNOSIS — Z6834 Body mass index (BMI) 34.0-34.9, adult: Secondary | ICD-10-CM

## 2023-06-29 DIAGNOSIS — E039 Hypothyroidism, unspecified: Secondary | ICD-10-CM

## 2023-06-29 DIAGNOSIS — E782 Mixed hyperlipidemia: Secondary | ICD-10-CM

## 2023-06-29 DIAGNOSIS — Z0000 Encounter for general adult medical examination without abnormal findings: Secondary | ICD-10-CM

## 2023-06-29 DIAGNOSIS — E119 Type 2 diabetes mellitus without complications: Secondary | ICD-10-CM

## 2023-06-29 DIAGNOSIS — Z1211 Encounter for screening for malignant neoplasm of colon: Secondary | ICD-10-CM

## 2023-06-29 NOTE — Progress Notes (Signed)
Subjective:    Patient ID: Sarah Massey, female   DOB: 05-16-1971, 52 y.o.   MRN: [id number]   HPI  CPE with pap  1.  Pap:  Last 12/2020 and normal.    2.  Mammogram:  Last 04/15/2023 and normal.  No family history of breast cancer.    3.  Osteoprevention:  3 servings of dairy daily--yogurt and almond milk.  Walks 40 minutes 5 days weekly.    4.  Guaiac Cards/ FIT:  Negative 02/2023.    5.  Colonoscopy:  Never.  No family history of colon cancer.    6.  Immunizations:  Has had respiratory illness 1 week after receiving influenza vaccine 3 times.  She does not want another.  Not clear this is a true allergy. She has not had COVID vaccine this year.  She feels she gets sicker since being good about getting vaccines.   Immunization History  Administered Date(s) Administered   Influenza Split 06/22/2012   Influenza,inj,Quad PF,6+ Mos 06/04/2017   Moderna Covid-19 Fall Seasonal Vaccine 48yrs & older 07/10/2022   Moderna Covid-19 Vaccine Bivalent Booster 40yrs & up 02/12/2022   PFIZER(Purple Top)SARS-COV-2 Vaccination 12/07/2019, 08/20/2020, 12/27/2020   Pneumococcal Polysaccharide-23 06/24/2022   Td 09/23/2020   Tdap 09/14/2008   Zoster Recombinant(Shingrix) 02/26/2022, 06/24/2022      7.  Glucose/Cholesterol:  DM with A1C now at 6.1%.  Cholesterol actually increased.  She admits to missing her Atorvastatin frequently.   Lipid Panel     Component Value Date/Time   CHOL 222 (H) 06/25/2023 0849   TRIG 140 06/25/2023 0849   HDL 42 06/25/2023 0849   CHOLHDL 3.8 Ratio 02/27/2010 2044   VLDL 40 02/27/2010 2044   LDLCALC 155 (H) 06/25/2023 0849   LABVLDL 25 06/25/2023 0849   8.  Hypothyroidism:  TSH in good range.  Current Meds  Medication Sig   atorvastatin (LIPITOR) 20 MG tablet 1 tab by mouth with evening meal.   Blood Glucose Monitoring Suppl (AGAMATRIX PRESTO) w/Device KIT Check blood sugar twice daily before meals   cetirizine (ZYRTEC) 10 MG tablet Take 10  mg by mouth daily.   glucose blood (AGAMATRIX PRESTO TEST) test strip Check blood sugar twice daily before meals   levothyroxine (EUTHYROX) 50 MCG tablet Take 1 tablet (50 mcg total) by mouth daily before breakfast.   metFORMIN (GLUCOPHAGE-XR) 500 MG 24 hr tablet TAKE 1 TABLET BY MOUTH TWICE DAILY WITH MEALS   Allergies  Allergen Reactions   Flublok [Influenza Vaccine Recombinant] Shortness Of Breath   Past Medical History:  Diagnosis Date   Asthma 01/25/2012   Depression    pt is depressed about her current pregnancy   DM type 2 (diabetes mellitus, type 2) (HCC)    Fibroid    Gestational diabetes    previous pregnancy   Hyperlipidemia    Hypothyroidism    Obesity    PONV (postoperative nausea and vomiting)    Postmenopausal bleeding 03/24/2021   Ultrasound with [facility] showed stable uterine fibroid and right ovarian dermoid cyst. Likely due to cervical polyp   Past Surgical History:  Procedure Laterality Date   CESAREAN SECTION  2005   DILATION AND CURETTAGE OF UTERUS  2007   Family History  Problem Relation Age of Onset   Thyroid disease Father    Hypertension Mother    Diabetes Mother    Heart disease Mother        MI cause of death   Fibroids Sister  Thyroid disease Sister    Early death Maternal Grandfather    Thyroid disease Sister    GER disease Son    Anesthesia problems Neg Hx    Social History   Socioeconomic History   Marital status: Married    Spouse name: Leo Rod   Number of children: 6   Years of education: 3   Highest education level: 3rd grade  Occupational History   Occupation: Research officer, trade union  Tobacco Use   Smoking status: Never   Smokeless tobacco: Never  Vaping Use   Vaping status: Never Used  Substance and Sexual Activity   Alcohol use: No   Drug use: No   Sexual activity: Yes    Birth control/protection: Condom, Post-menopausal    Comment: Husband with hx of genital herpes, so they continue the condom usage.  Other Topics  Concern   Not on file  Social History Narrative   Her 3 oldest children live on their own with their partners   She lives at home with her 3 youngest children and her husband      Social Determinants of Health   Financial Resource Strain: Low Risk  (08/22/2020)   Overall Financial Resource Strain (CARDIA)    Difficulty of Paying Living Expenses: Not hard at all  Food Insecurity: No Food Insecurity (04/15/2023)   Hunger Vital Sign    Worried About Running Out of Food in the Last Year: Never true    Ran Out of Food in the Last Year: Never true  Transportation Needs: No Transportation Needs (04/15/2023)   PRAPARE - Administrator, Civil Service (Medical): No    Lack of Transportation (Non-Medical): No  Physical Activity: Sufficiently Active (02/03/2018)   Exercise Vital Sign    Days of Exercise per Week: 7 days    Minutes of Exercise per Session: 60 min  Stress: Not on file  Social Connections: Unknown (01/26/2022)   Received from [facility], [facility]   Social Network    Social Network: Not on file  Intimate Partner Violence: Unknown (12/18/2021)   Received from [facility], [facility]   HITS    Physically Hurt: Not on file    Insult or Talk Down To: Not on file    Threaten Physical Harm: Not on file    Scream or Curse: Not on file   SDOH without change from August.     Review of Systems  HENT:  Negative for dental problem (Was seen by Dental clinic and provided fillings for cavities.).   Respiratory:  Negative for shortness of breath.   Cardiovascular:  Negative for chest pain, palpitations and leg swelling.  Gastrointestinal:  Positive for constipation (Only when traveling and then has abdominal pain.). Negative for blood in stool (No melena).  Neurological:  Negative for weakness and numbness.  Psychiatric/Behavioral:  Negative for dysphoric mood. The patient is not nervous/anxious.       Objective:   BP 128/80 (BP Location: Left Arm, Patient  Position: Sitting, Cuff Size: Normal)   Pulse 74   Resp 16   Ht 5' (1.524 m)   Wt 166 lb (75.3 kg)   LMP 08/14/2018 (Approximate)   BMI 32.42 kg/m   Physical Exam Constitutional:      Appearance: She is obese.  HENT:     Head: Normocephalic and atraumatic.     Right Ear: Tympanic membrane, ear canal and external ear normal.     Left Ear: Tympanic membrane, ear canal and external ear normal.  Nose: Nose normal.     Mouth/Throat:     Mouth: Mucous membranes are moist.     Pharynx: Oropharynx is clear.  Eyes:     Extraocular Movements: Extraocular movements intact.     Conjunctiva/sclera: Conjunctivae normal.     Pupils: Pupils are equal, round, and reactive to light.     Comments: Discs sharp  Neck:     Thyroid: Thyromegaly (nodular) present.  Cardiovascular:     Rate and Rhythm: Normal rate and regular rhythm.     Pulses:          Dorsalis pedis pulses are 2+ on the right side and 2+ on the left side.       Posterior tibial pulses are 2+ on the right side and 2+ on the left side.     Heart sounds: S1 normal and S2 normal. No murmur heard.    No friction rub. No S3 or S4 sounds.     Comments: No carotid bruits.  Carotid, radial, femoral, DP and PT pulses normal and equal.   Pulmonary:     Effort: Pulmonary effort is normal.     Breath sounds: Normal breath sounds and air entry.  Chest:  Breasts:    Right: Inverted nipple (chronic) present. No mass or nipple discharge.     Left: Inverted nipple (chronic) present. No mass or nipple discharge.  Abdominal:     General: Bowel sounds are normal.     Palpations: Abdomen is soft. There is no hepatomegaly, splenomegaly or mass.     Tenderness: There is no abdominal tenderness.     Hernia: No hernia is present.  Genitourinary:    Comments: Normal external female genitalia No uterine or adnexal mass or tenderness.   Musculoskeletal:        General: Normal range of motion.     Cervical back: Normal range of motion and neck  supple.     Right lower leg: No edema.     Left lower leg: No edema.  Feet:     Right foot:     Protective Sensation: 10 sites tested.  10 sites sensed.     Skin integrity: Skin integrity normal.     Toenail Condition: Right toenails are normal.     Left foot:     Protective Sensation: 10 sites tested.  10 sites sensed.     Skin integrity: Skin integrity normal.     Toenail Condition: Left toenails are normal.  Lymphadenopathy:     Head:     Right side of head: No submental or submandibular adenopathy.     Left side of head: No submental or submandibular adenopathy.     Cervical: No cervical adenopathy.     Upper Body:     Right upper body: No supraclavicular or axillary adenopathy.     Left upper body: No supraclavicular or axillary adenopathy.     Lower Body: No right inguinal adenopathy. No left inguinal adenopathy.  Skin:    General: Skin is warm.     Capillary Refill: Capillary refill takes less than 2 seconds.     Findings: No rash.  Neurological:     General: No focal deficit present.     Mental Status: She is alert and oriented to person, place, and time.     Cranial Nerves: Cranial nerves 2-12 are intact.     Sensory: Sensation is intact.     Motor: Motor function is intact.     Coordination: Coordination is intact.  Gait: Gait is intact.     Deep Tendon Reflexes: Reflexes are normal and symmetric.  Psychiatric:        Speech: Speech normal.        Behavior: Behavior normal. Behavior is cooperative.      Assessment & Plan   CPE without pap Mammogram up to date. Checking with [facility] for screening colonoscopy--not sure if covered with financial assistance.    2.  Hypercholesterolemia: Not at goal.  Missing Atorvastatin frequently,  She has already bought a pill box.  Recheck again in 2 months after she obtains 3 time a day pill box.  3.  DM:  good control.  Follow up in 6 months.  Continue to work on lifestyle.  Repeat eye referral.    4.  Hypothyroidism  with nodular goiter:  TSH in good range  5.  Husband with one time outbreak of HSV per patient.  She would like to have this further evaluated as to whether he actually had HSV and whether they can stop condoms.  Discussed suppressive therapy as well. Encouraged her and Eulis Foster to make a joint appt to get history and discuss.

## 2023-06-29 NOTE — Telephone Encounter (Signed)
Patient has been seen.

## 2023-07-21 ENCOUNTER — Other Ambulatory Visit: Payer: Self-pay

## 2023-07-21 MED ORDER — ATORVASTATIN CALCIUM 20 MG PO TABS: ORAL_TABLET | ORAL | 11 refills | Status: DC

## 2023-08-02 ENCOUNTER — Telehealth: Payer: Self-pay | Admitting: Psychology

## 2023-08-03 MED ORDER — ROSUVASTATIN CALCIUM 10 MG PO TABS: 10.0000 mg | ORAL_TABLET | Freq: Every day | ORAL | 11 refills | Status: DC

## 2023-08-03 NOTE — Telephone Encounter (Signed)
Patient is aware of instructions and medication change.

## 2023-08-03 NOTE — Telephone Encounter (Signed)
Have her wait for a couple of weeks and make sure the discomfort it gone. I have a coupon I printed for Rosuvastatin with [facility]--she will need to sign up with [facility] to get it. Switch to Rosuvastatin 10 mg daily.

## 2023-08-31 ENCOUNTER — Other Ambulatory Visit: Payer: Self-pay

## 2023-08-31 DIAGNOSIS — E782 Mixed hyperlipidemia: Secondary | ICD-10-CM

## 2023-09-01 LAB — LIPID PANEL W/O CHOL/HDL RATIO
Cholesterol, Total: 125 mg/dL (ref 100–199)
HDL: 44 mg/dL (ref >39)
LDL Chol Calc (NIH): 58 mg/dL (ref 0–99)
Triglycerides: 128 mg/dL (ref 0–149)
VLDL Cholesterol Cal: 23 mg/dL (ref 5–40)

## 2023-09-29 ENCOUNTER — Other Ambulatory Visit (INDEPENDENT_AMBULATORY_CARE_PROVIDER_SITE_OTHER): Payer: Self-pay

## 2023-09-29 DIAGNOSIS — R300 Dysuria: Secondary | ICD-10-CM

## 2023-09-29 LAB — POCT URINALYSIS DIPSTICK
Bilirubin, UA: NEGATIVE
Glucose, UA: NEGATIVE
Ketones, UA: NEGATIVE
Nitrite, UA: NEGATIVE
Protein, UA: NEGATIVE
Spec Grav, UA: 1.02 (ref 1.010–1.025)
Urobilinogen, UA: 0.2 U/dL
pH, UA: 7 (ref 5.0–8.0)

## 2023-09-29 MED ORDER — NITROFURANTOIN MONOHYD MACRO 100 MG PO CAPS: 100.0000 mg | ORAL_CAPSULE | Freq: Two times a day (BID) | ORAL | 0 refills | Status: AC

## 2023-09-29 NOTE — Progress Notes (Signed)
 Patient reports hematuria, dysuria, abdominal pain, incontinence since yesterday 09/28/2023. Patient is postmenopausal. She has not taken any medication for this problem.  Per Dr Jayne Mews, MACROBID  100mg  twice daily for 7 days has been sent to the pharmacy. Patient instructed to call us  in two days with a progress report.

## 2023-10-01 LAB — URINE CULTURE

## 2023-10-21 ENCOUNTER — Other Ambulatory Visit: Payer: Self-pay

## 2023-10-21 MED ORDER — LEVOTHYROXINE SODIUM 50 MCG PO TABS: 50.0000 ug | ORAL_TABLET | Freq: Every day | ORAL | 9 refills | Status: DC

## 2023-12-28 ENCOUNTER — Other Ambulatory Visit: Payer: Self-pay

## 2023-12-28 DIAGNOSIS — E119 Type 2 diabetes mellitus without complications: Secondary | ICD-10-CM

## 2023-12-29 LAB — HEMOGLOBIN A1C
Est. average glucose Bld gHb Est-mCnc: 131 mg/dL
Hgb A1c MFr Bld: 6.2 % — ABNORMAL HIGH (ref 4.8–5.6)

## 2024-01-26 ENCOUNTER — Other Ambulatory Visit: Payer: Self-pay | Admitting: Internal Medicine

## 2024-06-06 ENCOUNTER — Other Ambulatory Visit: Payer: Self-pay | Admitting: Obstetrics and Gynecology

## 2024-06-06 DIAGNOSIS — Z1231 Encounter for screening mammogram for malignant neoplasm of breast: Secondary | ICD-10-CM

## 2024-06-30 ENCOUNTER — Other Ambulatory Visit (INDEPENDENT_AMBULATORY_CARE_PROVIDER_SITE_OTHER): Payer: Self-pay

## 2024-06-30 DIAGNOSIS — E039 Hypothyroidism, unspecified: Secondary | ICD-10-CM

## 2024-06-30 DIAGNOSIS — E782 Mixed hyperlipidemia: Secondary | ICD-10-CM

## 2024-06-30 DIAGNOSIS — E119 Type 2 diabetes mellitus without complications: Secondary | ICD-10-CM

## 2024-06-30 DIAGNOSIS — Z0000 Encounter for general adult medical examination without abnormal findings: Secondary | ICD-10-CM

## 2024-07-01 LAB — COMPREHENSIVE METABOLIC PANEL WITH GFR
ALT: 22 IU/L (ref 0–32)
AST: 24 IU/L (ref 0–40)
Albumin: 4.7 g/dL (ref 3.8–4.9)
Alkaline Phosphatase: 75 IU/L (ref 49–135)
BUN/Creatinine Ratio: 17 (ref 9–23)
BUN: 10 mg/dL (ref 6–24)
Bilirubin Total: 0.4 mg/dL (ref 0.0–1.2)
CO2: 23 mmol/L (ref 20–29)
Calcium: 9.4 mg/dL (ref 8.7–10.2)
Chloride: 102 mmol/L (ref 96–106)
Creatinine, Ser: 0.6 mg/dL (ref 0.57–1.00)
Globulin, Total: 2.8 g/dL (ref 1.5–4.5)
Glucose: 105 mg/dL — ABNORMAL HIGH (ref 70–99)
Potassium: 4.6 mmol/L (ref 3.5–5.2)
Sodium: 139 mmol/L (ref 134–144)
Total Protein: 7.5 g/dL (ref 6.0–8.5)
eGFR: 107 mL/min/1.73 (ref >59)

## 2024-07-01 LAB — CBC WITH DIFFERENTIAL/PLATELET
Basophils Absolute: 0 x10E3/uL (ref 0.0–0.2)
Basos: 1 %
EOS (ABSOLUTE): 0.3 x10E3/uL (ref 0.0–0.4)
Eos: 4 %
Hematocrit: 40.1 % (ref 34.0–46.6)
Hemoglobin: 13.4 g/dL (ref 11.1–15.9)
Immature Grans (Abs): 0 x10E3/uL (ref 0.0–0.1)
Immature Granulocytes: 0 %
Lymphocytes Absolute: 2.9 x10E3/uL (ref 0.7–3.1)
Lymphs: 50 %
MCH: 31 pg (ref 26.6–33.0)
MCHC: 33.4 g/dL (ref 31.5–35.7)
MCV: 93 fL (ref 79–97)
Monocytes Absolute: 0.4 x10E3/uL (ref 0.1–0.9)
Monocytes: 6 %
Neutrophils Absolute: 2.3 x10E3/uL (ref 1.4–7.0)
Neutrophils: 39 %
Platelets: 209 x10E3/uL (ref 150–450)
RBC: 4.32 x10E6/uL (ref 3.77–5.28)
RDW: 13.3 % (ref 11.7–15.4)
WBC: 5.9 x10E3/uL (ref 3.4–10.8)

## 2024-07-01 LAB — MICROALBUMIN / CREATININE URINE RATIO
Creatinine, Urine: 53.4 mg/dL
Microalb/Creat Ratio: 7 mg/g{creat} (ref 0–29)
Microalbumin, Urine: 3.6 ug/mL

## 2024-07-01 LAB — HEMOGLOBIN A1C
Est. average glucose Bld gHb Est-mCnc: 131 mg/dL
Hgb A1c MFr Bld: 6.2 % — ABNORMAL HIGH (ref 4.8–5.6)

## 2024-07-01 LAB — LIPID PANEL W/O CHOL/HDL RATIO
Cholesterol, Total: 133 mg/dL (ref 100–199)
HDL: 42 mg/dL (ref >39)
LDL Chol Calc (NIH): 64 mg/dL (ref 0–99)
Triglycerides: 160 mg/dL — ABNORMAL HIGH (ref 0–149)
VLDL Cholesterol Cal: 27 mg/dL (ref 5–40)

## 2024-07-01 LAB — TSH: TSH: 4.12 u[IU]/mL (ref 0.450–4.500)

## 2024-07-04 ENCOUNTER — Encounter: Payer: Self-pay | Admitting: Internal Medicine

## 2024-07-04 ENCOUNTER — Ambulatory Visit: Payer: Self-pay | Admitting: Internal Medicine

## 2024-07-04 VITALS — BP 142/90 | HR 77 | Resp 16 | Ht 60.5 in | Wt 172.5 lb

## 2024-07-04 DIAGNOSIS — Z6834 Body mass index (BMI) 34.0-34.9, adult: Secondary | ICD-10-CM

## 2024-07-04 DIAGNOSIS — E119 Type 2 diabetes mellitus without complications: Secondary | ICD-10-CM

## 2024-07-04 DIAGNOSIS — E66811 Obesity, class 1: Secondary | ICD-10-CM

## 2024-07-04 DIAGNOSIS — E039 Hypothyroidism, unspecified: Secondary | ICD-10-CM

## 2024-07-04 DIAGNOSIS — N6452 Nipple discharge: Secondary | ICD-10-CM

## 2024-07-04 DIAGNOSIS — Z124 Encounter for screening for malignant neoplasm of cervix: Secondary | ICD-10-CM

## 2024-07-04 DIAGNOSIS — Z0000 Encounter for general adult medical examination without abnormal findings: Secondary | ICD-10-CM

## 2024-07-04 DIAGNOSIS — E782 Mixed hyperlipidemia: Secondary | ICD-10-CM

## 2024-07-04 LAB — POCT WET PREP WITH KOH
Clue Cells Wet Prep HPF POC: NEGATIVE
KOH Prep POC: NEGATIVE
RBC Wet Prep HPF POC: NEGATIVE
Trichomonas, UA: NEGATIVE
Yeast Wet Prep HPF POC: NEGATIVE

## 2024-07-04 MED ORDER — LANCET DEVICE MISC: 0 refills | Status: AC

## 2024-07-04 MED ORDER — EMPAGLIFLOZIN 10 MG PO TABS: 10.0000 mg | ORAL_TABLET | Freq: Every day | ORAL | 11 refills | Status: AC

## 2024-07-04 MED ORDER — CLOTRIMAZOLE 1 % EX CREA: TOPICAL_CREAM | CUTANEOUS | Status: DC

## 2024-07-04 MED ORDER — LANCETS MISC: 11 refills | Status: AC

## 2024-07-04 MED ORDER — BLOOD GLUCOSE MONITORING SUPPL DEVI: 0 refills | Status: AC

## 2024-07-04 MED ORDER — BLOOD GLUCOSE TEST VI STRP: ORAL_STRIP | 11 refills | Status: AC

## 2024-07-04 MED ORDER — OZEMPIC (0.25 OR 0.5 MG/DOSE) 2 MG/3ML ~~LOC~~ SOPN: PEN_INJECTOR | SUBCUTANEOUS | 11 refills | Status: DC

## 2024-07-04 NOTE — Progress Notes (Signed)
 "   Subjective:    Patient ID: Sarah Massey, female   DOB: 26-Jul-1971, 53 y.o.   MRN: [id number]   HPI  CPE with pap  1.  Pap:  Last in 12/2020 and normal.    2.  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 She was seen by surgery, Dr. Aron in 2024 and gel like clear discharge felt to be due to inverted nipple most likely.  No concern. Normal mammogram 04/2023, but has not yet had this year.  Continues with clear left nipple discharge.  Has 2025 order for December with Dr. Alger.  3.  Osteoprevention:  Taking Caltrate with D 500 to 600 mg twice daily.  She walks for 2 hours twice daily.  Walk in the mall.  Does get outside for 30-60 minutes daily.  4.  Guaiac Cards/FIT:  Last 02/2023 and negative for blood.  5.  Colonoscopy:  never.  No family history of colon cancer.    6.  Immunizations:  Declines influenza and COVID vaccinations.  She will think about the pneumococcal 20.  Immunization History  Administered Date(s) Administered   Fluzone Influenza virus vaccine,trivalent (IIV3), split virus 12/02/2011   Influenza Split 06/22/2012   Influenza,inj,Quad PF,6+ Mos 06/04/2017   Moderna Covid-19 Fall Seasonal Vaccine 27yrs & older 07/10/2022   Moderna Covid-19 Vaccine  Bivalent Booster 51yrs & up 02/12/2022   PFIZER(Purple Top)SARS-COV-2 Vaccination 12/07/2019, 12/28/2019, 08/20/2020, 12/27/2020   Pneumococcal Polysaccharide-23 06/24/2022   Td 09/23/2020   Tdap 09/14/2008   Zoster Recombinant(Shingrix ) 02/26/2022, 06/24/2022     7.  Glucose/Cholesterol:  A1C is 6.2% at goal with diabetic control.  Cholesterol at goal with total and LDL, but trigs and HDL a bit above and below goal. Lipid Panel     Component Value Date/Time   CHOL 133 06/30/2024 0854   TRIG 160 (H) 06/30/2024 0854   HDL 42 06/30/2024 0854   CHOLHDL 3.8 Ratio 02/27/2010 2044   VLDL 40 02/27/2010 2044   LDLCALC 64 06/30/2024 0854    LABVLDL 27 06/30/2024 0854     Current Meds  Medication Sig   Calcium -Vitamin D (CALTRATE 600 PLUS-VIT D PO) Take 1 tablet by mouth 2 (two) times daily.   cetirizine (ZYRTEC) 10 MG tablet Take 10 mg by mouth daily.   levothyroxine  (EUTHYROX ) 50 MCG tablet Take 1 tablet (50 mcg total) by mouth daily before breakfast.   metFORMIN  (GLUCOPHAGE -XR) 500 MG 24 hr tablet TAKE 1 TABLET BY MOUTH TWICE DAILY WITH MEALS   rosuvastatin  (CRESTOR ) 10 MG tablet Take 1 tablet (10 mg total) by mouth daily.   Allergies  Allergen Reactions   Flublok [Influenza Vaccine Recombinant] Other (See Comments)    Not clear a true allergy.  She gets sick as if has influenza infection 1 week later.  States it has happened 3 times.  She has never been seen when she is having the symptoms.     Past Medical History:  Diagnosis Date   Asthma 01/25/2012   Depression    pt is depressed about her current pregnancy   DM type 2 (diabetes mellitus, type 2) (HCC)    Fibroid    Gestational diabetes    previous pregnancy   Hyperlipidemia    Hypothyroidism    Obesity    PONV (postoperative nausea and vomiting)    Postmenopausal bleeding 03/24/2021   Ultrasound with [facility] showed stable uterine fibroid and right ovarian dermoid cyst. Likely due to cervical  polyp   Past Surgical History:  Procedure Laterality Date   CESAREAN SECTION  2005   DILATION AND CURETTAGE OF UTERUS  2007   Family History  Problem Relation Age of Onset   Thyroid  disease Father    Hypertension Mother    Diabetes Mother    Heart disease Mother        MI cause of death   Fibroids Sister    Thyroid  disease Sister    Early death Maternal Grandfather    Thyroid  disease Sister    GER disease Son    Anesthesia problems Neg Hx    Social History   Socioeconomic History   Marital status: Married    Spouse name: Lyle Corn   Number of children: 6   Years of education: 3   Highest education level: 3rd grade  Occupational History    Occupation: Research Officer, [employer]  Tobacco Use   Smoking status: Never   Smokeless tobacco: Never  Vaping Use   Vaping status: Never Used  Substance and Sexual Activity   Alcohol use: No   Drug use: No   Sexual activity: Yes    Birth control/protection: Post-menopausal, Condom    Comment: Husband with hx of genital herpes, so they continue the condom usage.  Other Topics Concern   Not on file  Social History Narrative   Her 3 oldest children live on their own with their partners   She lives at home with her 3 youngest children and her husband      Social Drivers of Corporate Investment Banker Strain: Low Risk  (07/04/2024)   Overall Financial Resource Strain (CARDIA)    Difficulty of Paying Living Expenses: Not very hard  Food Insecurity: No Food Insecurity (07/04/2024)   Hunger Vital Sign    Worried About Running Out of Food in the Last Year: Never true    Ran Out of Food in the Last Year: Never true  Transportation Needs: No Transportation Needs (07/04/2024)   PRAPARE - Administrator, Civil Service (Medical): No    Lack of Transportation (Non-Medical): No  Physical Activity: Sufficiently Active (02/03/2018)   Exercise Vital Sign    Days of Exercise per Week: 7 days    Minutes of Exercise per Session: 60 min  Stress: Not on file  Social Connections: Unknown (01/26/2022)   Received from [facility]   Social Network    Social Network: Not on file  Intimate Partner Violence: Not At Risk (07/04/2024)   Humiliation, Afraid, Rape, and Kick questionnaire    Fear of Current or Ex-Partner: No    Emotionally Abused: No    Physically Abused: No    Sexually Abused: No     Review of Systems  HENT:  Negative for dental problem (Has had good check at dentist this year).   Eyes:  Negative for visual disturbance (Had eye check this year.  [facility] with good exam.).  Respiratory:  Negative for shortness of breath.   Cardiovascular:  Negative for chest pain and leg  swelling.      Objective:   BP (!) 142/90 (BP Location: Right Arm, Patient Position: Sitting, Cuff Size: Normal)   Pulse 77   Resp 16   Ht 5' 0.5 (1.537 m)   Wt 172 lb 8 oz (78.2 kg)   LMP 08/14/2018 (Approximate)   BMI 33.13 kg/m   Physical Exam Constitutional:      Appearance: She is obese.  HENT:     Head: Normocephalic  and atraumatic.     Right Ear: Tympanic membrane, ear canal and external ear normal.     Left Ear: Tympanic membrane, ear canal and external ear normal.     Nose: Nose normal.     Mouth/Throat:     Mouth: Mucous membranes are moist.     Pharynx: Oropharynx is clear.  Eyes:     Extraocular Movements: Extraocular movements intact.     Conjunctiva/sclera: Conjunctivae normal.     Pupils: Pupils are equal, round, and reactive to light.     Comments: Discs sharp  Neck:     Thyroid : No thyroid  mass or thyromegaly (nodular).  Cardiovascular:     Rate and Rhythm: Normal rate and regular rhythm.     Pulses:          Dorsalis pedis pulses are 2+ on the right side and 2+ on the left side.       Posterior tibial pulses are 2+ on the right side and 2+ on the left side.     Heart sounds: S1 normal and S2 normal. No murmur heard.    No friction rub. No S3 or S4 sounds.     Comments: No carotid bruits.  Carotid, radial, femoral, DP and PT pulses normal and equal.   Pulmonary:     Effort: Pulmonary effort is normal.     Breath sounds: Normal breath sounds and air entry.  Chest:  Breasts:    Right: Inverted nipple present. No mass or nipple discharge.     Left: Inverted nipple and nipple discharge (thin white with mild irritation of inverted tissue) present. No mass.  Abdominal:     General: Bowel sounds are normal.     Palpations: Abdomen is soft. There is no hepatomegaly, splenomegaly or mass.     Tenderness: There is no abdominal tenderness.     Hernia: No hernia is present.  Genitourinary:    Comments: Normal external female genitalia No cervical or  vaginal mucosal lesions or inflammation No uterine or adnexal mass or tenderness. Musculoskeletal:        General: Normal range of motion.     Cervical back: Normal range of motion and neck supple.     Right lower leg: No edema.     Left lower leg: No edema.  Feet:     Right foot:     Protective Sensation: 10 sites tested.  10 sites sensed.     Skin integrity: Skin integrity normal.     Left foot:     Protective Sensation: 10 sites tested.  10 sites sensed.     Skin integrity: Skin integrity normal.  Lymphadenopathy:     Head:     Right side of head: No submental or submandibular adenopathy.     Left side of head: No submental or submandibular adenopathy.     Cervical: No cervical adenopathy.     Upper Body:     Right upper body: No supraclavicular or axillary adenopathy.     Left upper body: No supraclavicular or axillary adenopathy.     Lower Body: No right inguinal adenopathy. No left inguinal adenopathy.  Skin:    General: Skin is warm.     Capillary Refill: Capillary refill takes less than 2 seconds.     Findings: No rash.  Neurological:     General: No focal deficit present.     Mental Status: She is alert and oriented to person, place, and time.     Cranial Nerves: Cranial  nerves 2-12 are intact.     Sensory: Sensation is intact.     Motor: Motor function is intact.     Coordination: Coordination is intact.     Gait: Gait is intact.     Deep Tendon Reflexes: Reflexes are normal and symmetric.  Psychiatric:        Mood and Affect: Mood normal.        Speech: Speech normal.        Behavior: Behavior normal. Behavior is cooperative.      Assessment & Plan   CPE with pap Mammogram planned for December FIT to return in 2 weeks Will consider Pneumococcal 20 Encouraged COVID and influenza, declined   2.  Yeast of left nipple--lotrimin  to affected area twice daily Thinking about pneumococcal 20  3.  DM:  Interested in better control.  Start Ozempic  0.25 mg weekly  and Jardiance  10 mg daily, with plans to wean Metformin .  Every 2 week follow up once starts Ozempic   4.  Hyperlipidemia:  LDL and total at goal.  To continue to work on lifestyle changes.  Perhaps with weight loss, will get all to goal as well.  5.  Hypothyroidism:  US  last year did not show enlargement or nodules, TSH in normal range with replacement of hormone "

## 2024-07-05 ENCOUNTER — Other Ambulatory Visit: Payer: Self-pay | Admitting: Internal Medicine

## 2024-07-06 LAB — CYTOLOGY - PAP

## 2024-07-11 ENCOUNTER — Ambulatory Visit: Payer: Self-pay | Admitting: Internal Medicine

## 2024-07-21 ENCOUNTER — Other Ambulatory Visit: Payer: Self-pay

## 2024-07-21 VITALS — Wt 171.0 lb

## 2024-07-21 DIAGNOSIS — Z1211 Encounter for screening for malignant neoplasm of colon: Secondary | ICD-10-CM

## 2024-07-21 MED ORDER — OZEMPIC (0.25 OR 0.5 MG/DOSE) 2 MG/3ML ~~LOC~~ SOPN: PEN_INJECTOR | SUBCUTANEOUS | 11 refills | Status: DC

## 2024-07-21 NOTE — Addendum Note (Signed)
 Addended by: MONETTA SPRAGUE R on: 07/21/2024 01:06 PM   Modules accepted: Level of Service

## 2024-07-21 NOTE — Addendum Note (Signed)
 Addended by: MONETTA SPRAGUE R on: 07/21/2024 01:01 PM   Modules accepted: Orders

## 2024-07-21 NOTE — Progress Notes (Signed)
 Weight 171 lb Taking Metformin  XR 500 mg once daily for 3 weeks when started Ozempic HA and GI side effects with Metformin  and would like to stop Also taking Jardiance 10 mg daily Sugars AM:  99-117 Sugars PM:  111-145 Lost 1 lb from 2 weeks ago, but in total, has gained weight from start of Ozempic  Stopping Metformin  and asking her to increase Ozempic in 2 weeks to 0.5 mg

## 2024-07-25 LAB — POC FIT TEST STOOL: Fecal Occult Blood: NEGATIVE

## 2024-07-25 NOTE — Addendum Note (Signed)
 Addended by: MONETTA SPRAGUE R on: 07/25/2024 08:56 AM   Modules accepted: Orders

## 2024-07-26 ENCOUNTER — Ambulatory Visit: Payer: Self-pay | Admitting: Internal Medicine

## 2024-07-31 NOTE — Progress Notes (Signed)
 The patient is notified about her FIT Test being negative

## 2024-08-04 ENCOUNTER — Other Ambulatory Visit: Payer: Self-pay

## 2024-08-17 ENCOUNTER — Inpatient Hospital Stay
Admission: RE | Admit: 2024-08-17 | Discharge: 2024-08-17 | Disposition: A | Payer: Self-pay | Source: Ambulatory Visit | Attending: Obstetrics and Gynecology | Admitting: Obstetrics and Gynecology

## 2024-08-17 ENCOUNTER — Ambulatory Visit: Payer: Self-pay | Admitting: *Deleted

## 2024-08-17 VITALS — Wt 171.0 lb

## 2024-08-17 DIAGNOSIS — Z1231 Encounter for screening mammogram for malignant neoplasm of breast: Secondary | ICD-10-CM

## 2024-08-17 DIAGNOSIS — Z1239 Encounter for other screening for malignant neoplasm of breast: Secondary | ICD-10-CM

## 2024-08-17 NOTE — Progress Notes (Signed)
 [name] is a 53 y.o. female who presents to [facility] clinic today with complaint of occasional left spontaneous milky discharge since 2022 that has been worked up. Patients last screening mammogram was completed 04/16/2023 and negative.    Pap Smear: Pap smear not completed today. Last Pap smear was 07/05/2024 at [facility] clinic and was normal. Per patient has no history of an abnormal Pap smear. Last Pap smear result is available in Epic.   Physical exam: Breasts Breasts symmetrical. No skin abnormalities bilateral breasts. No nipple retraction right breast. Left nipple slightly inverted that per patient is normal for her noted on previous exam 03/19/2022 and 11/28/2020. No nipple discharge bilateral breasts. Unable to express any nipple discharge from left breast on exam. No lymphadenopathy. No lumps palpated bilateral breasts. No complaints of pain or tenderness on exam.       MS 3D SCR MAMMO BILAT BR (aka MM) Result Date: 04/16/2023 CLINICAL DATA:  Screening. EXAM: DIGITAL SCREENING BILATERAL MAMMOGRAM WITH TOMOSYNTHESIS AND CAD TECHNIQUE: Bilateral screening digital craniocaudal and mediolateral oblique mammograms were obtained. Bilateral screening digital breast tomosynthesis was performed. The images were evaluated with computer-aided detection. COMPARISON:  Previous exam(s). ACR Breast Density Category b: There are scattered areas of fibroglandular density. FINDINGS: There are no findings suspicious for malignancy. IMPRESSION: No mammographic evidence of malignancy. A result letter of this screening mammogram will be mailed directly to the patient. RECOMMENDATION: Screening mammogram in one year. (Code:SM-B-01Y) BI-RADS CATEGORY  1: Negative. Electronically Signed   By: Almarie Daring M.D.   On: 04/16/2023 16:29   MS DIGITAL DIAG TOMO BILAT Result Date: 04/03/2022 CLINICAL DATA:  Patient describes unilateral LEFT-sided nipple discharge which patient states is spontaneous and  white in color. EXAM: DIGITAL DIAGNOSTIC BILATERAL MAMMOGRAM WITH TOMOSYNTHESIS AND CAD; ULTRASOUND LEFT BREAST LIMITED TECHNIQUE: Bilateral digital diagnostic mammography and breast tomosynthesis was performed. The images were evaluated with computer-aided detection.; Targeted ultrasound examination of the left breast was performed. COMPARISON:  Previous exam(s). ACR Breast Density Category b: There are scattered areas of fibroglandular density. FINDINGS: There are no new dominant masses, suspicious calcifications or secondary signs of malignancy within either breast. Specifically, there is no mammographic abnormality within the subareolar or periareolar LEFT breast corresponding to the area of clinical concern. Targeted ultrasound is performed, showing minimal duct ectasia within the subareolar LEFT breast, without intraductal mass or vascularity. No solid or cystic mass is identified within the subareolar or periareolar LEFT breast. IMPRESSION: 1. No evidence of malignancy within either breast. 2. Minimal duct ectasia within the subareolar LEFT breast, possible correlate for the LEFT-sided nipple discharge which patient states is spontaneous and white in color. 3. Causes of unilateral nipple discharge include: Hormonal changes, fibrocystic changes, benign papilloma, abscess/mastitis, birth control pills, endocrine disorders, injury/trauma to breast, duct ectasia, medications, prolactinoma, and breast cancer. As is evident from this list, nipple discharge often stems from a benign condition, however, breast cancer is a possibility when unilateral spontaneous persistent single duct discharge (especially bloody or clear discharge) is present. RECOMMENDATION: 1. Breast MRI with contrast for further characterization of patient's nipple discharge. Recommendation for breast MRI based on data provided by the [employer] of Radiology Appropriateness Criteria, produced by an expert panel on breast imaging, showing  breast MRI with contrast to have a high sensitivity and NPV for identifying the cause of pathologic nipple discharge. 2. Breast surgery consultation may be eventually required as central ductal excision might become necessary for definitive diagnosis and/or definitive therapy. I  have discussed the findings and recommendations with the patient with the aid of an interpreter. If applicable, a reminder letter will be sent to the patient regarding the next appointment. BI-RADS CATEGORY  2: Benign. However, breast MRI is recommended for the LEFT-sided nipple discharge, as detailed above. Electronically Signed   By: Lael Hines M.D.   On: 04/03/2022 15:48  MS DIGITAL DIAG TOMO UNI LEFT Result Date: 12/19/2020 CLINICAL DATA:  The patient was called back for left breast asymmetry. EXAM: DIGITAL DIAGNOSTIC UNILATERAL LEFT MAMMOGRAM WITH TOMOSYNTHESIS AND CAD TECHNIQUE: Left digital diagnostic mammography and breast tomosynthesis was performed. The images were evaluated with computer-aided detection. COMPARISON:  Previous exam(s). ACR Breast Density Category b: There are scattered areas of fibroglandular density. FINDINGS: The left breast asymmetry resolves on today's imaging. IMPRESSION: No mammographic evidence of malignancy. RECOMMENDATION: Annual screening mammography. I have discussed the findings and recommendations with the patient. If applicable, a reminder letter will be sent to the patient regarding the next appointment. BI-RADS CATEGORY  1: Negative. Electronically Signed   By: Alm Pouch III M.D   On: 12/19/2020 10:46   MS DIGITAL SCREENING TOMO BILATERAL Result Date: 11/29/2020 CLINICAL DATA:  Screening. EXAM: DIGITAL SCREENING BILATERAL MAMMOGRAM WITH TOMOSYNTHESIS AND CAD TECHNIQUE: Bilateral screening digital craniocaudal and mediolateral oblique mammograms were obtained. Bilateral screening digital breast tomosynthesis was performed. The images were evaluated with computer-aided detection.  COMPARISON:  Previous exam(s). ACR Breast Density Category b: There are scattered areas of fibroglandular density. FINDINGS: In the left breast, a possible asymmetry warrants further evaluation. In the right breast, no findings suspicious for malignancy. IMPRESSION: Further evaluation is suggested for possible asymmetry in the left breast. RECOMMENDATION: Diagnostic mammogram and possibly ultrasound of the left breast. (Code:FI-L-20M) The patient will be contacted regarding the findings, and additional imaging will be scheduled. BI-RADS CATEGORY  0: Incomplete. Need additional imaging evaluation and/or prior mammograms for comparison. Electronically Signed   By: Toribio Agreste M.D.   On: 11/29/2020 13:22    Pelvic/Bimanual Pap is not indicated today per BCCCP guidelines.   Smoking History: Patient has never smoked.   Patient Navigation: Patient education provided. Access to services provided for patient through [place] program. Spanish interpreter Bernice Angry from [facility] provided.    Colorectal Cancer Screening: Per patient has never had colonoscopy completed. Patient stated she completed a FIT Test 07/21/2024 that was given by her PCP that was negative. FIT Test given to patient today to complete. No complaints today.    Breast and Cervical Cancer Risk Assessment: Patient does not have family history of breast cancer, known genetic mutations, or radiation treatment to the chest before age 28. Patient does not have history of cervical dysplasia, immunocompromised, or DES exposure in-utero.  Risk Scores as of Encounter on 08/17/2024     Alisa           5-year 0.79%   Lifetime 6.2%   This patient is [place]/Latina but has no documented birth country, so the [place] model used data from [place] patients to calculate their risk score. Document a birth country in the Demographics activity for a more accurate score.         Last calculated by Silas, Ansyi K, CMA on 08/17/2024 at  9:31 AM         A: BCCCP exam without pap smear No new complaints.  P: Referred patient to the [facility] for a screening mammogram on mobile unit. Appointment scheduled Thursday, August 17, 2024 at 0900.  Driscilla Wanda SQUIBB, RN 08/17/2024 9:41 AM

## 2024-08-17 NOTE — Patient Instructions (Addendum)
 Explained breast self awareness with Sarah Massey. Patient did not need a Pap smear today due to last Pap smear was 07/05/2024. Let her know BCCCP will cover Pap smears every 3 years unless has a history of abnormal Pap smears. Referred patient to the [facility] for a screening mammogram on mobile unit. Appointment scheduled Thursday, August 17, 2024 at 0900. Patient aware of appointment and will be there. Let patient know the Breast Center will follow up with her within the next couple weeks with results of her mammogram by letter or phone. Sarah Massey verbalized understanding.  Dale Ribeiro, Wanda Ship, RN 9:41 AM

## 2024-08-18 ENCOUNTER — Other Ambulatory Visit: Payer: Self-pay

## 2024-08-18 NOTE — Progress Notes (Signed)
 Today weight = 171  Last time wight = 171 Her sugar  AM= 91-105   pm = 107 - 145  SHE says that she checks afternoon sugar AFTER meal not before meal.   Patient reports that she has been on ozempic  .5mg  weekly for 2 weeks. Patient also takes Jardiance  10mg  daily and Metformin  500mg  BID.

## 2024-08-18 NOTE — Progress Notes (Signed)
 Return in 2 weeks same--no change to meds

## 2024-08-26 ENCOUNTER — Ambulatory Visit: Payer: Self-pay | Admitting: Obstetrics & Gynecology

## 2024-09-01 ENCOUNTER — Other Ambulatory Visit: Payer: Self-pay

## 2024-09-01 NOTE — Progress Notes (Signed)
 Weight today = 171 lb  [place] am 89=103 pm 94-133 Patient is taking Jardiance  10mg  daily, Metformin  500mg  BID, and Ozempic  0.5mg  weekly. Patient has been on Ozempic  0.5mg  for 2 weeks.   Talked with Dr. Burleigh . She needs to follow up in two weeks   The patient called

## 2024-09-22 ENCOUNTER — Other Ambulatory Visit: Payer: Self-pay

## 2024-09-22 NOTE — Progress Notes (Unsigned)
 The weight today 169 lb  Last weight 171lb  Sugars  AM = 89-113  PM= 89-135 The patient takes Jardiance  10 mg daily , Metformin  500 mg twice , Ozempic  0.5 mg for three weeks  Tell her stop metformin . Go to 1 ozempic  and send ozempic  to pharmacy and and come in two weeks after start of ozempic 

## 2024-09-25 MED ORDER — SEMAGLUTIDE (1 MG/DOSE) 4 MG/3ML ~~LOC~~ SOPN: 1.0000 mg | PEN_INJECTOR | SUBCUTANEOUS | 11 refills | Status: AC

## 2024-10-06 ENCOUNTER — Other Ambulatory Visit: Payer: Self-pay

## 2024-10-06 ENCOUNTER — Other Ambulatory Visit: Payer: Self-pay | Admitting: Internal Medicine

## 2024-10-24 ENCOUNTER — Other Ambulatory Visit: Payer: Self-pay

## 2024-11-10 ENCOUNTER — Other Ambulatory Visit: Payer: Self-pay

## 2024-11-14 ENCOUNTER — Ambulatory Visit: Payer: Self-pay | Admitting: Internal Medicine

## 2025-07-06 ENCOUNTER — Other Ambulatory Visit: Payer: Self-pay | Admitting: Internal Medicine

## 2025-07-10 ENCOUNTER — Encounter: Payer: Self-pay | Admitting: Internal Medicine
